# Patient Record
Sex: Female | Born: 1965 | Race: White | Hispanic: No | Smoking: Current every day smoker
Health system: Southern US, Community
[De-identification: ages and names within clinical notes are randomized; demographics above are authoritative.]

## PROBLEM LIST (undated history)

## (undated) ENCOUNTER — Emergency Department (HOSPITAL_COMMUNITY): Admission: EM | Payer: Self-pay | Source: Home / Self Care

## (undated) DIAGNOSIS — M797 Fibromyalgia: Secondary | ICD-10-CM

## (undated) DIAGNOSIS — F32A Depression, unspecified: Secondary | ICD-10-CM

## (undated) DIAGNOSIS — E0500 Thyrotoxicosis with diffuse goiter without thyrotoxic crisis or storm: Secondary | ICD-10-CM

## (undated) DIAGNOSIS — J449 Chronic obstructive pulmonary disease, unspecified: Secondary | ICD-10-CM

## (undated) DIAGNOSIS — G5600 Carpal tunnel syndrome, unspecified upper limb: Secondary | ICD-10-CM

## (undated) DIAGNOSIS — E039 Hypothyroidism, unspecified: Secondary | ICD-10-CM

## (undated) DIAGNOSIS — F419 Anxiety disorder, unspecified: Secondary | ICD-10-CM

## (undated) DIAGNOSIS — K219 Gastro-esophageal reflux disease without esophagitis: Secondary | ICD-10-CM

## (undated) DIAGNOSIS — F329 Major depressive disorder, single episode, unspecified: Secondary | ICD-10-CM

## (undated) HISTORY — DX: Fibromyalgia: M79.7

## (undated) HISTORY — DX: Carpal tunnel syndrome, unspecified upper limb: G56.00

## (undated) HISTORY — PX: KNEE SURGERY: SHX244

## (undated) HISTORY — DX: Depression, unspecified: F32.A

## (undated) HISTORY — PX: OVARIAN CYST SURGERY: SHX726

## (undated) HISTORY — DX: Thyrotoxicosis with diffuse goiter without thyrotoxic crisis or storm: E05.00

## (undated) HISTORY — DX: Gastro-esophageal reflux disease without esophagitis: K21.9

## (undated) HISTORY — DX: Anxiety disorder, unspecified: F41.9

## (undated) HISTORY — DX: Major depressive disorder, single episode, unspecified: F32.9

## (undated) HISTORY — PX: ABDOMINAL HYSTERECTOMY: SHX81

---

## 2010-09-16 ENCOUNTER — Emergency Department (HOSPITAL_COMMUNITY): Payer: No Typology Code available for payment source

## 2010-09-16 ENCOUNTER — Emergency Department (HOSPITAL_COMMUNITY)
Admission: EM | Admit: 2010-09-16 | Discharge: 2010-09-16 | Disposition: A | Discharge status: Home / Self Care | Payer: No Typology Code available for payment source | Attending: Emergency Medicine | Admitting: Emergency Medicine

## 2010-09-16 DIAGNOSIS — F1010 Alcohol abuse, uncomplicated: Secondary | ICD-10-CM | POA: Insufficient documentation

## 2010-09-16 DIAGNOSIS — T07XXXA Unspecified multiple injuries, initial encounter: Secondary | ICD-10-CM | POA: Insufficient documentation

## 2010-09-16 DIAGNOSIS — M25579 Pain in unspecified ankle and joints of unspecified foot: Secondary | ICD-10-CM | POA: Insufficient documentation

## 2010-09-16 DIAGNOSIS — M25569 Pain in unspecified knee: Secondary | ICD-10-CM | POA: Insufficient documentation

## 2010-09-16 DIAGNOSIS — M79609 Pain in unspecified limb: Secondary | ICD-10-CM | POA: Insufficient documentation

## 2010-09-16 DIAGNOSIS — R404 Transient alteration of awareness: Secondary | ICD-10-CM | POA: Insufficient documentation

## 2012-09-06 ENCOUNTER — Other Ambulatory Visit (HOSPITAL_COMMUNITY): Payer: Self-pay | Admitting: "Endocrinology

## 2012-09-06 DIAGNOSIS — R7989 Other specified abnormal findings of blood chemistry: Secondary | ICD-10-CM

## 2012-09-09 ENCOUNTER — Encounter (HOSPITAL_COMMUNITY): Payer: Self-pay

## 2012-09-09 ENCOUNTER — Encounter (HOSPITAL_COMMUNITY)
Admission: RE | Admit: 2012-09-09 | Discharge: 2012-09-09 | Disposition: A | Discharge status: Home / Self Care | Payer: BC Managed Care – PPO | Source: Ambulatory Visit | Attending: "Endocrinology | Admitting: "Endocrinology

## 2012-09-09 DIAGNOSIS — R7989 Other specified abnormal findings of blood chemistry: Secondary | ICD-10-CM

## 2012-09-09 DIAGNOSIS — R946 Abnormal results of thyroid function studies: Secondary | ICD-10-CM | POA: Insufficient documentation

## 2012-09-09 MED ORDER — SODIUM IODIDE I 131 CAPSULE
8.0000 | Freq: Once | INTRAVENOUS | Status: AC | PRN
  Administered 2012-09-09: 8 via ORAL

## 2012-09-10 ENCOUNTER — Encounter (HOSPITAL_COMMUNITY)
Admission: RE | Admit: 2012-09-10 | Discharge: 2012-09-10 | Disposition: A | Discharge status: Home / Self Care | Payer: BC Managed Care – PPO | Source: Ambulatory Visit | Attending: "Endocrinology | Admitting: "Endocrinology

## 2012-09-10 ENCOUNTER — Other Ambulatory Visit (HOSPITAL_COMMUNITY): Payer: Self-pay | Admitting: "Endocrinology

## 2012-09-10 DIAGNOSIS — E0500 Thyrotoxicosis with diffuse goiter without thyrotoxic crisis or storm: Secondary | ICD-10-CM

## 2012-09-10 MED ORDER — SODIUM PERTECHNETATE TC 99M INJECTION
10.0000 | Freq: Once | INTRAVENOUS | Status: AC | PRN
  Administered 2012-09-10: 10 via INTRAVENOUS

## 2012-09-16 ENCOUNTER — Encounter (HOSPITAL_COMMUNITY): Payer: Self-pay

## 2012-09-16 ENCOUNTER — Encounter (HOSPITAL_COMMUNITY)
Admission: RE | Admit: 2012-09-16 | Discharge: 2012-09-16 | Disposition: A | Discharge status: Home / Self Care | Payer: BC Managed Care – PPO | Source: Ambulatory Visit | Attending: "Endocrinology | Admitting: "Endocrinology

## 2012-09-16 DIAGNOSIS — E0500 Thyrotoxicosis with diffuse goiter without thyrotoxic crisis or storm: Secondary | ICD-10-CM | POA: Insufficient documentation

## 2012-09-16 MED ORDER — SODIUM IODIDE I 131 CAPSULE
15.0000 | Freq: Once | INTRAVENOUS | Status: AC | PRN
  Administered 2012-09-16: 15 via ORAL

## 2014-03-29 IMAGING — NM NM RAI THERAPY FOR HYPERTHYROIDISM
1 series · 1 of 1 positions shown · non-contrast
Comparison: None.

CLINICAL DATA: Graves' disease

NUCLEAR MEDICINE RADIOACTIVE IODINE THERAPY FOR HYPERTHYROIDISM
TECHNIQUE: The risks and benefits of radioactive iodine therapy
were discussed with the patient in detail. Alternative therapies
were also mentioned. Radiation safety was discussed with the
patient, including how to protect the general public from exposure.
There were no barriers to communication.  Written consent was
obtained.  The patient then received a capsule containing the
radiopharmaceutical.  The patient will follow-up with the referring
physician.
Radiopharmaceutical: [id number] SEENA A-4M4 SODIUM IODIDE I 131
CAPSULE

[bone scan · 2.33mm/px · 1 of 1 slices shown]
[im 1/1]
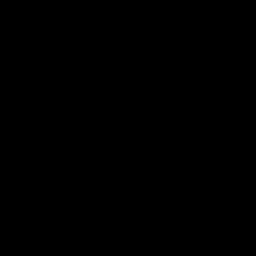

[1 of 1 positions shown; findings below may reference images not displayed]

IMPRESSION: Per oral administration of radiolabeled iodine for the treatment of
hyperthyroidism.

## 2014-11-12 ENCOUNTER — Ambulatory Visit (HOSPITAL_COMMUNITY)
Admission: RE | Admit: 2014-11-12 | Discharge: 2014-11-12 | Disposition: A | Discharge status: Home / Self Care | Payer: 59 | Source: Ambulatory Visit | Attending: Rheumatology | Admitting: Rheumatology

## 2014-11-12 ENCOUNTER — Other Ambulatory Visit (HOSPITAL_COMMUNITY): Payer: Self-pay | Admitting: Rheumatology

## 2014-11-12 DIAGNOSIS — M542 Cervicalgia: Secondary | ICD-10-CM | POA: Insufficient documentation

## 2014-11-12 DIAGNOSIS — M5032 Other cervical disc degeneration, mid-cervical region: Secondary | ICD-10-CM | POA: Insufficient documentation

## 2016-05-11 NOTE — Congregational Nurse Program (Signed)
Congregational Nurse Program Note  Date of Encounter: 05/11/2016  Past Medical History: No past medical history on file.  Encounter Details:     CNP Questionnaire - 05/11/16 1540      Patient Demographics   Is this a new or existing patient? New   Patient is considered a/an Not Applicable   Race Caucasian/White     Patient Assistance   Location of Patient Assistance [facility]   Patient's financial/insurance status Low Income;Self-Pay (Uninsured)   Uninsured Patient ([place]s) Yes   Interventions Counseled to make appt. with provider;Assisted patient in making appt.   Patient referred to apply for the following financial assistance Not Applicable   Food insecurities addressed Not Applicable   Transportation assistance No   Assistance securing medications No   Educational health offerings Chronic disease;Navigating the healthcare system;Behavioral health;Safety     Encounter Details   Primary purpose of visit Chronic Illness/Condition Visit;Education/Health Concerns;Navigating the Healthcare System;Safety;Other   Was an Emergency Department visit averted? Not Applicable   Does patient have a medical provider? No   Patient referred to Area Agency;Clinic;Establish PCP;Other   Was a mental health screening completed? (GAINS tool) No   Does patient have dental issues? No   Does patient have vision issues? No   Does your patient have an abnormal blood pressure today? No   Since previous encounter, have you referred patient for abnormal blood pressure that resulted in a new diagnosis or medication change? No   Does your patient have an abnormal blood glucose today? No   Since previous encounter, have you referred patient for abnormal blood glucose that resulted in a new diagnosis or medication change? No   Was there a life-saving intervention made? No     [facility]. Client has recently moved back to [facility] from [place]. She  lives with her boyfriend currently. Her complaint today is aching all over, no fever or chills with history of fibromyalgia. She has also within the past 2 weeks has been "crying a lot , emotional, with trouble sleeping . States she gets maybe 2 hours of sleep per night. She states her appetite has decreased also. She denies any thoughts of suicide and states she has never had past attempts. Last PCP appointment was in [facility] in 07/2015 when she still had health insurance. Client is tearful easily but alert and oriented to person and place and answers questions appropriately. She reports being out of some of her medications and doesn't know why she is so tearful often. She denies abuse at home and states she feels safe there. She states that her emotional status changed after moving back to [place] and losing her job and income and health insurance. Her boyfriend does not work, but is receiving other financial support from a parent of his. Past medical history per client: Graves disease, fibromyalgia, depression.irritable bowel. Past surgical History per client: Iodine 3 years ago here in [place] by Dr Fransico Him for her Graves. Hysterectomy 2001, Knee surgery. Medications per client: "bottles brought in" Diclofenac 75 mg 1-2 per day Dicyclomine 20 mg 1/2 tab - 1 as needed Tizanidine(has none) 4 mg 1 at night Levothyroxine 112 mcg 1 per day Duloxetine(only 3 capsules left) 60 mg 1 per day Flexeril (out of for 3-4 weeks patient doesn't take doesn't like how it makes her feel) as needed Melatonin  Allergies: Codiene causes stomach pain. Discussed options with client concerning primary medical care without income or health insurance. Client  [facility] [place]. Referral made and appointment secured for client for 05/15/16 appointment as well as contact information for the free clinic given to client. RN also discussed options for counseling regarding her depression and  increased stress. Client states she feels she needs "someone to talk to about her feelings" Client given [facility] number as well as walk in times [facility] . Client feels she wants to wait until after her medical appointment. Again denies suicidal thoughts and denies any abuse in the home and states she is safe at home. RN also offered to make an appointment with MSW intern with [facility] program Irwin Brakeman[facility] for next Thursday at 1:30 Pm [facility] for further needs assessment and assistance navigating Mental Health needs. RN again gave client Mental health Crisis number for [facility] as well as the days and times for walk in intakes at [facility] [place] and client gives verbal understanding of how to access these as needed. Client states she will meet with  Social Work MSW intern on 05/18/16 as scheduled . RN will call to follow up with client after her medical appointment with the [facility] on 05/15/16  All information as well as summary of recommendations today and contact information and appointment cards given to client upon completion of screening appointment. Will follow as needed.

## 2016-05-15 ENCOUNTER — Ambulatory Visit: Payer: Self-pay | Admitting: Physician Assistant

## 2016-05-15 ENCOUNTER — Encounter: Payer: Self-pay | Admitting: Physician Assistant

## 2016-05-15 VITALS — BP 128/70 | HR 82 | Temp 98.1°F | Ht 65.0 in | Wt 152.0 lb

## 2016-05-15 DIAGNOSIS — G8929 Other chronic pain: Secondary | ICD-10-CM

## 2016-05-15 DIAGNOSIS — F32A Depression, unspecified: Secondary | ICD-10-CM

## 2016-05-15 DIAGNOSIS — F17219 Nicotine dependence, cigarettes, with unspecified nicotine-induced disorders: Secondary | ICD-10-CM

## 2016-05-15 DIAGNOSIS — N898 Other specified noninflammatory disorders of vagina: Secondary | ICD-10-CM

## 2016-05-15 DIAGNOSIS — Z131 Encounter for screening for diabetes mellitus: Secondary | ICD-10-CM

## 2016-05-15 DIAGNOSIS — F329 Major depressive disorder, single episode, unspecified: Secondary | ICD-10-CM

## 2016-05-15 DIAGNOSIS — E890 Postprocedural hypothyroidism: Secondary | ICD-10-CM

## 2016-05-15 DIAGNOSIS — F1010 Alcohol abuse, uncomplicated: Secondary | ICD-10-CM

## 2016-05-15 DIAGNOSIS — Z13220 Encounter for screening for lipoid disorders: Secondary | ICD-10-CM

## 2016-05-15 LAB — POCT WET PREP (WET MOUNT): Trichomonas Wet Prep HPF POC: ABSENT

## 2016-05-15 LAB — GLUCOSE, POCT (MANUAL RESULT ENTRY): POC GLUCOSE: 96 mg/dL (ref 70–99)

## 2016-05-15 NOTE — Progress Notes (Signed)
BP 128/70 (BP Location: Left Arm, Patient Position: Sitting, Cuff Size: Normal)   Pulse 82   Temp 98.1 F (36.7 C)   Ht 5\' 5"  (1.651 m)   Wt 152 lb (68.9 kg)   SpO2 99%   BMI 25.29 kg/m    Subjective:    Patient ID: Tara Lewis, female    DOB: 1965/07/10, 51 y.o.   MRN: [id number]  HPI: Ashelynn Lewis is a 51 y.o. female presenting on 05/15/2016 for New Patient (Initial Visit); Mass (on L hand); Vaginal Discharge (tinted brown discharge for about a month, pt states itchy, denies burning.); and Mental Health Problem (pt has appointment with intern social worker with Congregational nursing.)   HPI   Chief Complaint  Patient presents with  . New Patient (Initial Visit)  . Mass    on L hand  . Vaginal Discharge    tinted brown discharge for about a month, pt states itchy, denies burning.  . Mental Health Problem    pt has appointment with intern social worker with Congregational nursing.     Pt states no sexual desire.  Last intercourse approx October.  Her fiancee, Tara Lewis,  is with her today.   Pt is not working.  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  She has filed for disability.    Pt states depression started about 2 months ago.  She says no big changes but she just started feeling this way.    Pt states hysterectomy about 2000 for "cysts kept bursting".  She had ovaries removed also.   Pt with vaginal discharge.  Says it comes and goes.  She says she can't smell because she had TBI in 2010.  Mass L hand only sometimes has pressure feeling.  Says it gets bigger and smaller.   She says she saw dr Case an othopedist who ordered an [place] at imaging place in [place].  She was told it was a fluid pocket.   Previous PCP dr Margo Common in [place]  Denies SOB.  States smoking more now that is depressed.    Relevant past medical, surgical, family and social history reviewed and updated as indicated. Interim medical history since our  last visit reviewed. Allergies and medications reviewed and updated.   Current Outpatient Prescriptions:  .  diclofenac (VOLTAREN) 75 MG EC tablet, Take 75 mg by mouth 2 (two) times daily., Disp: , Rfl:  .  dicyclomine (BENTYL) 20 MG tablet, Take 10-20 mg by mouth 2 (two) times daily as needed (irritable bowel)., Disp: , Rfl:  .  levothyroxine (SYNTHROID, LEVOTHROID) 112 MCG tablet, Take 112 mcg by mouth daily before breakfast., Disp: , Rfl:  .  cyclobenzaprine (FLEXERIL) 5 MG tablet, Take 5 mg by mouth daily as needed for muscle spasms., Disp: , Rfl:  .  DULoxetine (CYMBALTA) 60 MG capsule, Take 60 mg by mouth daily., Disp: , Rfl:  .  tiZANidine (ZANAFLEX) 4 MG tablet, Take 4 mg by mouth at bedtime as needed for muscle spasms., Disp: , Rfl:    Review of Systems  Constitutional: Positive for appetite change, chills, diaphoresis and fatigue. Negative for fever and unexpected weight change.  HENT: Positive for dental problem. Negative for congestion, drooling, ear pain, facial swelling, hearing loss, mouth sores, sneezing, sore throat, trouble swallowing and voice change.   Eyes: Positive for visual disturbance. Negative for pain, discharge, redness and itching.  Respiratory: Positive for shortness of breath. Negative for cough,  choking and wheezing.   Cardiovascular: Negative for chest pain, palpitations and leg swelling.  Gastrointestinal: Positive for abdominal pain and diarrhea. Negative for blood in stool, constipation and vomiting.  Endocrine: Positive for cold intolerance, heat intolerance and polydipsia.  Genitourinary: Negative for decreased urine volume, dysuria and hematuria.  Musculoskeletal: Positive for arthralgias, back pain and gait problem.  Skin: Negative for rash.  Allergic/Immunologic: Positive for environmental allergies.  Neurological: Positive for light-headedness and headaches. Negative for seizures and syncope.  Hematological: Negative for adenopathy.    Psychiatric/Behavioral: Positive for agitation and dysphoric mood. Negative for suicidal ideas. The patient is nervous/anxious.     Per HPI unless specifically indicated above     Objective:    BP 128/70 (BP Location: Left Arm, Patient Position: Sitting, Cuff Size: Normal)   Pulse 82   Temp 98.1 F (36.7 C)   Ht 5\' 5"  (1.651 m)   Wt 152 lb (68.9 kg)   SpO2 99%   BMI 25.29 kg/m   Wt Readings from Last 3 Encounters:  05/15/16 152 lb (68.9 kg)  05/11/16 151 lb 6.4 oz (68.7 kg)    Physical Exam  Constitutional: She is oriented to person, place, and time. She appears well-developed and well-nourished.  HENT:  Head: Normocephalic and atraumatic.  Mouth/Throat: Oropharynx is clear and moist. No oropharyngeal exudate.  Eyes: Conjunctivae and EOM are normal. Pupils are equal, round, and reactive to light.  Neck: Neck supple. No thyromegaly present.  Cardiovascular: Normal rate and regular rhythm.   Pulmonary/Chest: Effort normal and breath sounds normal.  Abdominal: Soft. Bowel sounds are normal. She exhibits no mass. There is no hepatosplenomegaly. There is no tenderness.  Genitourinary: Vagina normal. There is no rash, tenderness or lesion on the right labia. There is no rash or tenderness on the left labia. No erythema in the vagina. No foreign body in the vagina. No vaginal discharge found.  Genitourinary Comments: (nurse Berenice assisted)  Musculoskeletal: She exhibits no edema.       Left hand: She exhibits normal range of motion, no tenderness, no bony tenderness and normal capillary refill.       Hands: Left hand with what feels like cyst.  Non-tender. No redness.    Lymphadenopathy:    She has no cervical adenopathy.  Neurological: She is alert and oriented to person, place, and time. Gait normal.  Skin: Skin is warm and dry.  Psychiatric: She has a normal mood and affect. Her behavior is normal.  Vitals reviewed.   Results for orders placed or performed in visit on  05/15/16  POCT Glucose (CBG)  Result Value Ref Range   POC Glucose 96 70 - 99 mg/dl      Assessment & Plan:   Encounter Diagnoses  Name Primary?  . Postablative hypothyroidism Yes  . Depression, unspecified depression type   . Alcohol abuse   . Cigarette nicotine dependence with nicotine-induced disorder   . Other chronic pain   . Vaginal discharge   . Screening for diabetes mellitus (DM)   . Screening cholesterol level     -[facility] ([facility]) -discussed alcohol and Pt believes she alcoholic.  Gave list local AA meetings and encouraged her to attend -Requested records for US hand and last mammogram, both [facility] [place] -pt will get baseline labs tomorrow morning while fasting -discussed with pt that [facility] does not treat chronic pain.  She states understanding -pt to contact MH.  She has card for [facility] and I counseled her to contact asap to get help with her mood disorder -follow up 3 weeks.  RTO sooner prn

## 2016-05-22 ENCOUNTER — Telehealth: Payer: Self-pay

## 2016-05-22 NOTE — Telephone Encounter (Signed)
Called to follow up with client regarding appointment with MSW intern for this week. Client scheduled for 05/25/16. Client will call to reschedule if she cannot come on that date.

## 2016-06-05 ENCOUNTER — Ambulatory Visit: Payer: Self-pay | Admitting: Physician Assistant

## 2016-06-12 LAB — COMPREHENSIVE METABOLIC PANEL
ALBUMIN: 4 g/dL (ref 3.6–5.1)
ALK PHOS: 63 U/L (ref 33–130)
ALT: 8 U/L (ref 6–29)
AST: 13 U/L (ref 10–35)
BILIRUBIN TOTAL: 0.6 mg/dL (ref 0.2–1.2)
BUN: 13 mg/dL (ref 7–25)
CALCIUM: 9.6 mg/dL (ref 8.6–10.4)
CO2: 27 mmol/L (ref 20–31)
Chloride: 107 mmol/L (ref 98–110)
Creat: 0.76 mg/dL (ref 0.50–1.05)
Glucose, Bld: 96 mg/dL (ref 65–99)
Potassium: 4.1 mmol/L (ref 3.5–5.3)
Sodium: 142 mmol/L (ref 135–146)
Total Protein: 6.8 g/dL (ref 6.1–8.1)

## 2016-06-12 LAB — CBC WITH DIFFERENTIAL/PLATELET
BASOS ABS: 81 {cells}/uL (ref 0–200)
BASOS PCT: 1 %
EOS PCT: 4 %
Eosinophils Absolute: 324 cells/uL (ref 15–500)
HCT: 43.4 % (ref 35.0–45.0)
Hemoglobin: 14.6 g/dL (ref 11.7–15.5)
Lymphocytes Relative: 23 %
Lymphs Abs: 1863 cells/uL (ref 850–3900)
MCH: 30.8 pg (ref 27.0–33.0)
MCHC: 33.6 g/dL (ref 32.0–36.0)
MCV: 91.6 fL (ref 80.0–100.0)
MONOS PCT: 8 %
MPV: 9.7 fL (ref 7.5–12.5)
Monocytes Absolute: 648 cells/uL (ref 200–950)
NEUTROS ABS: 5184 {cells}/uL (ref 1500–7800)
Neutrophils Relative %: 64 %
PLATELETS: 266 10*3/uL (ref 140–400)
RBC: 4.74 MIL/uL (ref 3.80–5.10)
RDW: 13.1 % (ref 11.0–15.0)
WBC: 8.1 10*3/uL (ref 3.8–10.8)

## 2016-06-12 LAB — LIPID PANEL
CHOL/HDL RATIO: 5.1 ratio — AB (ref <5.0)
CHOLESTEROL: 255 mg/dL — AB (ref <200)
HDL: 50 mg/dL — ABNORMAL LOW (ref >50)
LDL Cholesterol: 166 mg/dL — ABNORMAL HIGH (ref <100)
Triglycerides: 195 mg/dL — ABNORMAL HIGH (ref <150)
VLDL: 39 mg/dL — ABNORMAL HIGH (ref <30)

## 2016-06-12 LAB — HEMOGLOBIN A1C
Hgb A1c MFr Bld: 4.9 % (ref <5.7)
Mean Plasma Glucose: 94 mg/dL

## 2016-06-12 LAB — TSH: TSH: 2.76 mIU/L

## 2016-06-13 ENCOUNTER — Encounter: Payer: Self-pay | Admitting: Physician Assistant

## 2016-06-13 ENCOUNTER — Ambulatory Visit: Payer: Self-pay | Admitting: Physician Assistant

## 2016-06-13 ENCOUNTER — Other Ambulatory Visit: Payer: Self-pay | Admitting: Physician Assistant

## 2016-06-13 VITALS — BP 108/62 | HR 56 | Temp 97.9°F | Ht 65.0 in | Wt 157.0 lb

## 2016-06-13 DIAGNOSIS — Z1211 Encounter for screening for malignant neoplasm of colon: Secondary | ICD-10-CM

## 2016-06-13 DIAGNOSIS — Z1239 Encounter for other screening for malignant neoplasm of breast: Secondary | ICD-10-CM

## 2016-06-13 DIAGNOSIS — E785 Hyperlipidemia, unspecified: Secondary | ICD-10-CM

## 2016-06-13 DIAGNOSIS — F1010 Alcohol abuse, uncomplicated: Secondary | ICD-10-CM

## 2016-06-13 DIAGNOSIS — R2232 Localized swelling, mass and lump, left upper limb: Secondary | ICD-10-CM

## 2016-06-13 DIAGNOSIS — F17219 Nicotine dependence, cigarettes, with unspecified nicotine-induced disorders: Secondary | ICD-10-CM

## 2016-06-13 DIAGNOSIS — G8929 Other chronic pain: Secondary | ICD-10-CM

## 2016-06-13 DIAGNOSIS — F329 Major depressive disorder, single episode, unspecified: Secondary | ICD-10-CM

## 2016-06-13 DIAGNOSIS — F32A Depression, unspecified: Secondary | ICD-10-CM

## 2016-06-13 DIAGNOSIS — E890 Postprocedural hypothyroidism: Secondary | ICD-10-CM

## 2016-06-13 MED ORDER — DULOXETINE HCL 60 MG PO CPEP: 60.0000 mg | ORAL_CAPSULE | Freq: Every day | ORAL | 3 refills | Status: DC

## 2016-06-13 MED ORDER — LEVOTHYROXINE SODIUM 112 MCG PO TABS: 112.0000 ug | ORAL_TABLET | Freq: Every day | ORAL | 4 refills | Status: DC

## 2016-06-13 NOTE — Progress Notes (Signed)
BP 108/62 (BP Location: Left Arm, Patient Position: Sitting, Cuff Size: Normal)   Pulse (!) 56   Temp 97.9 F (36.6 C)   Ht _0  (1.651 m)   Wt 157 lb (71.2 kg)   SpO2 97%   BMI 26.13 kg/m    Subjective:    Patient ID: Tara Lewis, female    DOB: 1966-02-01, 51 y.o.   MRN: [id number]  HPI: Tara Lewis is a 51 y.o. female presenting on 06/13/2016 for Follow-up   HPI   Pt states mood improved. She is no longer engaged and thinks her fiance was a lot of her cause of crying.  She is still sleeping poorly.   Pt has not seen a counselor but doesn't know if she needs to now.    She says she is doing better with etoh.  She hasn't drank in 3 or 4 days.  She has also cut back on smoking.   Relevant past medical, surgical, family and social history reviewed and updated as indicated. Interim medical history since our last visit reviewed. Allergies and medications reviewed and updated.   Current Outpatient Prescriptions:  .  diclofenac (VOLTAREN) 75 MG EC tablet, Take 75 mg by mouth 2 (two) times daily as needed. , Disp: , Rfl:  .  dicyclomine (BENTYL) 20 MG tablet, Take 10-20 mg by mouth daily. , Disp: , Rfl:  .  DULoxetine (CYMBALTA) 60 MG capsule, Take 60 mg by mouth daily., Disp: , Rfl:  .  Melatonin 10 MG CAPS, Take 10 mg by mouth at bedtime., Disp: , Rfl:  .  tiZANidine (ZANAFLEX) 4 MG tablet, Take 4 mg by mouth at bedtime as needed for muscle spasms., Disp: , Rfl:  .  cyclobenzaprine (FLEXERIL) 5 MG tablet, Take 5 mg by mouth daily as needed for muscle spasms., Disp: , Rfl:  .  levothyroxine (SYNTHROID, LEVOTHROID) 112 MCG tablet, Take 112 mcg by mouth daily before breakfast., Disp: , Rfl:    Review of Systems  Constitutional: Positive for diaphoresis and fatigue. Negative for appetite change, chills, fever and unexpected weight change.  HENT: Positive for congestion, dental problem and sneezing. Negative for drooling, ear pain, facial swelling, hearing loss, mouth  sores, sore throat, trouble swallowing and voice change.   Eyes: Negative for pain, discharge, redness, itching and visual disturbance.  Respiratory: Positive for cough and shortness of breath. Negative for choking and wheezing.   Cardiovascular: Negative for chest pain, palpitations and leg swelling.  Gastrointestinal: Positive for abdominal pain and diarrhea. Negative for blood in stool, constipation and vomiting.  Endocrine: Positive for cold intolerance and heat intolerance. Negative for polydipsia.  Genitourinary: Negative for decreased urine volume, dysuria and hematuria.  Musculoskeletal: Positive for arthralgias and back pain. Negative for gait problem.  Skin: Negative for rash.  Allergic/Immunologic: Positive for environmental allergies.  Neurological: Positive for headaches. Negative for seizures, syncope and light-headedness.  Hematological: Negative for adenopathy.  Psychiatric/Behavioral: Positive for agitation and dysphoric mood. Negative for suicidal ideas. The patient is nervous/anxious.     Per HPI unless specifically indicated above     Objective:    BP 108/62 (BP Location: Left Arm, Patient Position: Sitting, Cuff Size: Normal)   Pulse (!) 56   Temp 97.9 F (36.6 C)   Ht _1  (1.651 m)   Wt 157 lb (71.2 kg)   SpO2 97%   BMI 26.13 kg/m   Wt Readings from Last 3 Encounters:  06/13/16 157 lb (71.2 kg)  05/15/16 152 lb (  68.9 kg)  05/11/16 151 lb 6.4 oz (68.7 kg)    Physical Exam  Constitutional: She is oriented to person, place, and time. She appears well-developed and well-nourished.  HENT:  Head: Normocephalic and atraumatic.  Neck: Neck supple.  Cardiovascular: Normal rate and regular rhythm.   Pulmonary/Chest: Effort normal and breath sounds normal.  Abdominal: Soft. Bowel sounds are normal. She exhibits no mass. There is no hepatosplenomegaly. There is no tenderness.  Musculoskeletal: She exhibits no edema.  Lymphadenopathy:    She has no cervical  adenopathy.  Neurological: She is alert and oriented to person, place, and time.  Skin: Skin is warm and dry.  Psychiatric: She has a normal mood and affect. Her behavior is normal.  Vitals reviewed.   Results for orders placed or performed in visit on 05/15/16  TSH  Result Value Ref Range   TSH 2.76 mIU/L  HgB A1c  Result Value Ref Range   Hgb A1c MFr Bld 4.9 <5.7 %   Mean Plasma Glucose 94 mg/dL  Comprehensive Metabolic Panel (CMET)  Result Value Ref Range   Sodium 142 135 - 146 mmol/L   Potassium 4.1 3.5 - 5.3 mmol/L   Chloride 107 98 - 110 mmol/L   CO2 27 20 - 31 mmol/L   Glucose, Bld 96 65 - 99 mg/dL   BUN 13 7 - 25 mg/dL   Creat 0.76 0.50 - 1.05 mg/dL   Total Bilirubin 0.6 0.2 - 1.2 mg/dL   Alkaline Phosphatase 63 33 - 130 U/L   AST 13 10 - 35 U/L   ALT 8 6 - 29 U/L   Total Protein 6.8 6.1 - 8.1 g/dL   Albumin 4.0 3.6 - 5.1 g/dL   Calcium 9.6 8.6 - 10.4 mg/dL  CBC w/Diff/Platelet  Result Value Ref Range   WBC 8.1 3.8 - 10.8 K/uL   RBC 4.74 3.80 - 5.10 MIL/uL   Hemoglobin 14.6 11.7 - 15.5 g/dL   HCT 43.4 35.0 - 45.0 %   MCV 91.6 80.0 - 100.0 fL   MCH 30.8 27.0 - 33.0 pg   MCHC 33.6 32.0 - 36.0 g/dL   RDW 13.1 11.0 - 15.0 %   Platelets 266 140 - 400 K/uL   MPV 9.7 7.5 - 12.5 fL   Neutro Abs 5,184 1,500 - 7,800 cells/uL   Lymphs Abs 1,863 850 - 3,900 cells/uL   Monocytes Absolute 648 200 - 950 cells/uL   Eosinophils Absolute 324 15 - 500 cells/uL   Basophils Absolute 81 0 - 200 cells/uL   Neutrophils Relative % 64 %   Lymphocytes Relative 23 %   Monocytes Relative 8 %   Eosinophils Relative 4 %   Basophils Relative 1 %   Smear Review Criteria for review not met   Lipid Profile  Result Value Ref Range   Cholesterol 255 (H) <200 mg/dL   Triglycerides 195 (H) <150 mg/dL   HDL 50 (L) >50 mg/dL   Total CHOL/HDL Ratio 5.1 (H) <5.0 Ratio   VLDL 39 (H) <30 mg/dL   LDL Cholesterol 166 (H) <100 mg/dL  POCT Glucose (CBG)  Result Value Ref Range   POC Glucose 96  70 - 99 mg/dl  POCT Wet Prep (Wet [place])  Result Value Ref Range   Source Wet Prep POC     WBC, Wet Prep HPF POC     Bacteria Wet Prep HPF POC  Few   BACTERIA WET PREP MORPHOLOGY POC     Clue Cells Wet Prep  HPF POC None None   Clue Cells Wet Prep Whiff POC     Yeast Wet Prep HPF POC None    KOH Wet Prep POC none    Trichomonas Wet Prep HPF POC Absent Absent      Assessment & Plan:   Encounter Diagnoses  Name Primary?  . Postablative hypothyroidism Yes  . Hyperlipidemia, unspecified hyperlipidemia type   . Depression, unspecified depression type   . Cigarette nicotine dependence with nicotine-induced disorder   . Alcohol abuse   . Other chronic pain   . Mass of left hand     -reviewed labs with pt -Pt states statins makes legs hurt too bad. Discussed and she Will do lowfat diet and recheck in 3 months -order screening Mammogram -Continue current levothyroxine.  -she is given iFOBT for colon cancer screening -will Check hand [place] and notify pt if further evaluation needs to be done -encouraged pt to continue to decrease and even discontinue etoh -follow up 3 months.  RTO sooner prn

## 2016-06-20 ENCOUNTER — Other Ambulatory Visit: Payer: Self-pay | Admitting: Physician Assistant

## 2016-06-20 ENCOUNTER — Telehealth: Payer: Self-pay | Admitting: Student

## 2016-06-20 DIAGNOSIS — R2232 Localized swelling, mass and lump, left upper limb: Secondary | ICD-10-CM

## 2016-06-20 NOTE — Telephone Encounter (Signed)
Called patient on 06-14-16 to notify her that PA reviewed US done 07-23-2013. PA states that US recommends MRI to evaluate. Pt was given the option whether to get it done or not. Pt prefers to do the MRI. Pt was instructed to fill out and [facility] once received in the mail for financial assistance for the MRI. Pt verbalized understanding. [facility] application was mailed to patient 06-14-16. PA notified and will place order for scheduling.

## 2016-06-23 ENCOUNTER — Ambulatory Visit (HOSPITAL_COMMUNITY)
Admission: RE | Admit: 2016-06-23 | Discharge: 2016-06-23 | Disposition: A | Discharge status: Home / Self Care | Payer: Self-pay | Source: Ambulatory Visit | Attending: Physician Assistant | Admitting: Physician Assistant

## 2016-06-23 DIAGNOSIS — R2232 Localized swelling, mass and lump, left upper limb: Secondary | ICD-10-CM | POA: Insufficient documentation

## 2016-06-23 MED ORDER — GADOBENATE DIMEGLUMINE 529 MG/ML IV SOLN
14.0000 mL | Freq: Once | INTRAVENOUS | Status: AC | PRN
  Administered 2016-06-23: 14 mL via INTRAVENOUS

## 2016-06-29 ENCOUNTER — Other Ambulatory Visit: Payer: Self-pay | Admitting: Physician Assistant

## 2016-06-29 DIAGNOSIS — R2232 Localized swelling, mass and lump, left upper limb: Secondary | ICD-10-CM

## 2016-06-29 DIAGNOSIS — R9389 Abnormal findings on diagnostic imaging of other specified body structures: Secondary | ICD-10-CM

## 2016-07-10 ENCOUNTER — Encounter (HOSPITAL_COMMUNITY): Payer: Self-pay

## 2016-07-10 ENCOUNTER — Inpatient Hospital Stay (HOSPITAL_COMMUNITY)
Admission: RE | Admit: 2016-07-10 | Discharge: 2016-07-10 | Disposition: A | Discharge status: Home / Self Care | Payer: Self-pay | Source: Ambulatory Visit | Attending: Physician Assistant | Admitting: Physician Assistant

## 2016-07-11 ENCOUNTER — Encounter: Payer: Self-pay | Admitting: Physician Assistant

## 2016-08-16 ENCOUNTER — Other Ambulatory Visit: Payer: Self-pay | Admitting: Physician Assistant

## 2016-08-16 MED ORDER — DULOXETINE HCL 60 MG PO CPEP: 60.0000 mg | ORAL_CAPSULE | Freq: Every day | ORAL | 0 refills | Status: DC

## 2016-08-16 MED ORDER — LEVOTHYROXINE SODIUM 112 MCG PO TABS: 112.0000 ug | ORAL_TABLET | Freq: Every day | ORAL | 0 refills | Status: DC

## 2016-09-05 ENCOUNTER — Ambulatory Visit: Payer: Self-pay | Admitting: Physician Assistant

## 2016-09-13 ENCOUNTER — Encounter: Payer: Self-pay | Admitting: Orthopedic Surgery

## 2016-09-26 ENCOUNTER — Other Ambulatory Visit: Payer: Self-pay | Admitting: Physician Assistant

## 2016-10-03 ENCOUNTER — Other Ambulatory Visit: Payer: Self-pay | Admitting: Physician Assistant

## 2016-10-03 MED ORDER — DULOXETINE HCL 60 MG PO CPEP: 60.0000 mg | ORAL_CAPSULE | Freq: Every day | ORAL | 0 refills | Status: DC

## 2016-10-10 ENCOUNTER — Encounter: Payer: Self-pay | Admitting: Physician Assistant

## 2016-10-10 ENCOUNTER — Ambulatory Visit: Payer: Self-pay | Admitting: Physician Assistant

## 2016-10-10 VITALS — BP 110/78 | HR 94 | Temp 97.7°F | Ht 65.0 in | Wt 158.5 lb

## 2016-10-10 DIAGNOSIS — G8929 Other chronic pain: Secondary | ICD-10-CM

## 2016-10-10 DIAGNOSIS — E785 Hyperlipidemia, unspecified: Secondary | ICD-10-CM

## 2016-10-10 DIAGNOSIS — Z1239 Encounter for other screening for malignant neoplasm of breast: Secondary | ICD-10-CM

## 2016-10-10 DIAGNOSIS — E890 Postprocedural hypothyroidism: Secondary | ICD-10-CM

## 2016-10-10 MED ORDER — DICLOFENAC SODIUM 75 MG PO TBEC: 75.0000 mg | DELAYED_RELEASE_TABLET | Freq: Two times a day (BID) | ORAL | 1 refills | Status: DC | PRN

## 2016-10-10 NOTE — Progress Notes (Signed)
BP 110/78 (BP Location: Left Arm, Patient Position: Sitting, Cuff Size: Normal)   Pulse 94   Temp 97.7 F (36.5 C)   Ht 5\' 5"  (1.651 m)   Wt 158 lb 8 oz (71.9 kg)   SpO2 95%   BMI 26.38 kg/m    Subjective:    Patient ID: Tara Lewis Wickliff, female    DOB: 1965-06-19, 51 y.o.   MRN: [id number]  HPI: Tara Lewis Purdie is a 51 y.o. female presenting on 10/10/2016 for Hyperlipidemia; Thyroid Problem; Mental Health Problem; and Follow-up ([facility] to 08-30-16. pt states she was admitted for a infection in skin tissue in mouth, pt states it started off as a tooth infection)   HPI  Chief Complaint  Patient presents with  . Hyperlipidemia  . Thyroid Problem  . Mental Health Problem  . Follow-up    pt [facility] from 08-21[facility]16 to 08-30-16. pt states she was admitted for a infection in skin tissue in mouth, pt states it started off as a tooth infection    Pt says she sent ifobt in the mail  Pt says she is doing well  Relevant past medical, surgical, family and social history reviewed and updated as indicated. Interim medical history since our last visit reviewed. Allergies and medications reviewed and updated.   Current Outpatient Prescriptions:  .  DULoxetine (CYMBALTA) 60 MG capsule, Take 1 capsule (60 mg total) by mouth daily., Disp: 30 capsule, Rfl: 0 .  levothyroxine (SYNTHROID, LEVOTHROID) 112 MCG tablet, TAKE 1 TABLET (112 MCG TOTAL) BY MOUTH DAILY BEFORE BREAKFAST., Disp: 14 tablet, Rfl: 0 .  Melatonin 10 MG CAPS, Take 10 mg by mouth at bedtime., Disp: , Rfl:  .  cyclobenzaprine (FLEXERIL) 5 MG tablet, Take 5 mg by mouth daily as needed for muscle spasms., Disp: , Rfl:  .  diclofenac (VOLTAREN) 75 MG EC tablet, Take 75 mg by mouth 2 (two) times daily as needed. , Disp: , Rfl:  .  dicyclomine (BENTYL) 20 MG tablet, Take 10-20 mg by mouth daily. , Disp: , Rfl:  .  tiZANidine (ZANAFLEX) 4 MG tablet, Take 4 mg by  mouth at bedtime as needed for muscle spasms., Disp: , Rfl:   Review of Systems  Constitutional: Positive for fatigue. Negative for appetite change, chills, diaphoresis, fever and unexpected weight change.  HENT: Positive for congestion and sneezing. Negative for dental problem, drooling, ear pain, facial swelling, hearing loss, mouth sores, sore throat, trouble swallowing and voice change.   Eyes: Negative for pain, discharge, redness, itching and visual disturbance.  Respiratory: Positive for cough. Negative for choking, shortness of breath and wheezing.   Cardiovascular: Negative for chest pain, palpitations and leg swelling.  Gastrointestinal: Negative for abdominal pain, blood in stool, constipation, diarrhea and vomiting.  Endocrine: Negative for cold intolerance, heat intolerance and polydipsia.  Genitourinary: Negative for decreased urine volume, dysuria and hematuria.  Musculoskeletal: Positive for arthralgias. Negative for back pain and gait problem.  Skin: Negative for rash.  Allergic/Immunologic: Negative for environmental allergies.  Neurological: Positive for headaches. Negative for seizures, syncope and light-headedness.  Hematological: Negative for adenopathy.  Psychiatric/Behavioral: Positive for dysphoric mood. Negative for agitation and suicidal ideas. The patient is nervous/anxious.     Per HPI unless specifically indicated above     Objective:    BP 110/78 (BP Location: Left Arm, Patient Position: Sitting, Cuff Size: Normal)   Pulse 94   Temp 97.7  F (36.5 C)   Ht 5\' 5"  (1.651 m)   Wt 158 lb 8 oz (71.9 kg)   SpO2 95%   BMI 26.38 kg/m   Wt Readings from Last 3 Encounters:  10/10/16 158 lb 8 oz (71.9 kg)  06/13/16 157 lb (71.2 kg)  05/15/16 152 lb (68.9 kg)    Physical Exam  Constitutional: She is oriented to person, place, and time. She appears well-developed and well-nourished.  HENT:  Head: Normocephalic and atraumatic.  Neck: Neck supple.   Cardiovascular: Normal rate and regular rhythm.   Pulmonary/Chest: Effort normal and breath sounds normal.  Abdominal: Soft. Bowel sounds are normal. She exhibits no mass. There is no hepatosplenomegaly. There is no tenderness.  Musculoskeletal: She exhibits no edema.  Lymphadenopathy:    She has no cervical adenopathy.  Neurological: She is alert and oriented to person, place, and time.  Skin: Skin is warm and dry.  Psychiatric: She has a normal mood and affect. Her behavior is normal.  Vitals reviewed.         Assessment & Plan:   Encounter Diagnoses  Name Primary?  . Postablative hypothyroidism Yes  . Hyperlipidemia, unspecified hyperlipidemia type   . Screening for breast cancer   . Other chronic pain      -Pt to get labs drawn. Will call with results. Pt says she doesn't want to go on a statin due to myalgias in the past -will Reschedule mammogram -pt to follow up in 3 months. RTO sooner prn

## 2016-10-18 ENCOUNTER — Ambulatory Visit: Payer: Self-pay | Admitting: Orthopaedic Surgery

## 2016-10-18 ENCOUNTER — Other Ambulatory Visit: Payer: Self-pay | Admitting: Physician Assistant

## 2016-10-18 ENCOUNTER — Other Ambulatory Visit (HOSPITAL_COMMUNITY)
Admission: RE | Admit: 2016-10-18 | Discharge: 2016-10-18 | Disposition: A | Discharge status: Home / Self Care | Payer: No Typology Code available for payment source | Source: Ambulatory Visit | Attending: Physician Assistant | Admitting: Physician Assistant

## 2016-10-18 DIAGNOSIS — E785 Hyperlipidemia, unspecified: Secondary | ICD-10-CM

## 2016-10-18 DIAGNOSIS — E890 Postprocedural hypothyroidism: Secondary | ICD-10-CM

## 2016-10-18 LAB — COMPREHENSIVE METABOLIC PANEL
ALK PHOS: 75 U/L (ref 38–126)
ALT: 16 U/L (ref 14–54)
AST: 24 U/L (ref 15–41)
Albumin: 4 g/dL (ref 3.5–5.0)
Anion gap: 10 (ref 5–15)
BUN: 8 mg/dL (ref 6–20)
CALCIUM: 9.2 mg/dL (ref 8.9–10.3)
CO2: 24 mmol/L (ref 22–32)
Chloride: 105 mmol/L (ref 101–111)
Creatinine, Ser: 0.86 mg/dL (ref 0.44–1.00)
GFR calc Af Amer: >60 mL/min (ref >60)
GFR calc non Af Amer: >60 mL/min (ref >60)
GLUCOSE: 114 mg/dL — AB (ref 65–99)
Potassium: 3.7 mmol/L (ref 3.5–5.1)
SODIUM: 139 mmol/L (ref 135–145)
Total Bilirubin: 0.5 mg/dL (ref 0.3–1.2)
Total Protein: 7.2 g/dL (ref 6.5–8.1)

## 2016-10-18 LAB — LIPID PANEL
CHOLESTEROL: 235 mg/dL — AB (ref 0–200)
HDL: 43 mg/dL (ref >40)
LDL Cholesterol: 154 mg/dL — ABNORMAL HIGH (ref 0–99)
Total CHOL/HDL Ratio: 5.5 RATIO
Triglycerides: 192 mg/dL — ABNORMAL HIGH (ref <150)
VLDL: 38 mg/dL (ref 0–40)

## 2016-10-18 LAB — TSH: TSH: 0.953 u[IU]/mL (ref 0.350–4.500)

## 2016-11-15 ENCOUNTER — Ambulatory Visit: Payer: Self-pay | Admitting: Orthopaedic Surgery

## 2016-11-18 ENCOUNTER — Other Ambulatory Visit: Payer: Self-pay | Admitting: Physician Assistant

## 2016-12-01 ENCOUNTER — Other Ambulatory Visit: Payer: Self-pay | Admitting: Physician Assistant

## 2017-01-02 ENCOUNTER — Other Ambulatory Visit: Payer: Self-pay | Admitting: Physician Assistant

## 2017-01-03 ENCOUNTER — Other Ambulatory Visit: Payer: Self-pay | Admitting: Physician Assistant

## 2017-01-03 DIAGNOSIS — Z1231 Encounter for screening mammogram for malignant neoplasm of breast: Secondary | ICD-10-CM

## 2017-01-09 ENCOUNTER — Encounter: Payer: Self-pay | Admitting: Physician Assistant

## 2017-01-09 ENCOUNTER — Ambulatory Visit: Payer: Self-pay | Admitting: Physician Assistant

## 2017-01-09 VITALS — BP 120/86 | HR 89 | Temp 97.7°F | Ht 65.0 in | Wt 153.2 lb

## 2017-01-09 DIAGNOSIS — F32A Depression, unspecified: Secondary | ICD-10-CM

## 2017-01-09 DIAGNOSIS — G8929 Other chronic pain: Secondary | ICD-10-CM

## 2017-01-09 DIAGNOSIS — F329 Major depressive disorder, single episode, unspecified: Secondary | ICD-10-CM

## 2017-01-09 DIAGNOSIS — E890 Postprocedural hypothyroidism: Secondary | ICD-10-CM

## 2017-01-09 DIAGNOSIS — E785 Hyperlipidemia, unspecified: Secondary | ICD-10-CM

## 2017-01-09 DIAGNOSIS — F17219 Nicotine dependence, cigarettes, with unspecified nicotine-induced disorders: Secondary | ICD-10-CM

## 2017-01-09 NOTE — Progress Notes (Signed)
BP 120/86 (BP Location: Left Arm, Patient Position: Sitting, Cuff Size: Normal)   Pulse 89   Temp 97.7 F (36.5 C)   Ht  (1.651 m)   Wt 153 lb 4 oz (69.5 kg)   SpO2 99%   BMI 25.50 kg/m    Subjective:    Patient ID: Tara Lewis, female    DOB: 11/27/1965, 51 y.o.   MRN: [id number]  HPI: Tara Lewis is a 51 y.o. female presenting on 01/09/2017 for Thyroid Problem and Hyperlipidemia   HPI   Pt says someone called her about mammogram but she is waiting for a call back to schedule   Pt is still smoking but says she has cut back.  Pt was supposed to start welchol but never did.   She says she is doing well.    Relevant past medical, surgical, family and social history reviewed and updated as indicated. Interim medical history since our last visit reviewed. Allergies and medications reviewed and updated.    Current Outpatient Prescriptions:  .  DULoxetine (CYMBALTA) 60 MG capsule, TAKE ONE CAPSULE BY MOUTH DAILY, Disp: 30 capsule, Rfl: 0 .  levothyroxine (SYNTHROID, LEVOTHROID) 112 MCG tablet, TAKE ONE TABLET BY MOUTH ONCE DAILY BEFORE BREAKFAST, Disp: 30 tablet, Rfl: 2 .  Melatonin 10 MG CAPS, Take 10 mg by mouth at bedtime., Disp: , Rfl:    Review of Systems  Constitutional: Positive for diaphoresis and fatigue. Negative for appetite change, chills, fever and unexpected weight change.  HENT: Negative for congestion, dental problem, drooling, ear pain, facial swelling, hearing loss, mouth sores, sneezing, sore throat, trouble swallowing and voice change.   Eyes: Negative for pain, discharge, redness, itching and visual disturbance.  Respiratory: Positive for cough. Negative for choking, shortness of breath and wheezing.   Cardiovascular: Negative for chest pain, palpitations and leg swelling.  Gastrointestinal: Negative for abdominal pain, blood in stool, constipation, diarrhea and vomiting.  Endocrine: Positive for cold intolerance and heat intolerance.  Negative for polydipsia.  Genitourinary: Negative for decreased urine volume, dysuria and hematuria.  Musculoskeletal: Positive for arthralgias. Negative for back pain and gait problem.  Skin: Negative for rash.  Allergic/Immunologic: Positive for environmental allergies.  Neurological: Negative for seizures, syncope, light-headedness and headaches.  Hematological: Negative for adenopathy.  Psychiatric/Behavioral: Positive for agitation and dysphoric mood. Negative for suicidal ideas. The patient is nervous/anxious.     Per HPI unless specifically indicated above     Objective:    BP 120/86 (BP Location: Left Arm, Patient Position: Sitting, Cuff Size: Normal)   Pulse 89   Temp 97.7 F (36.5 C)   Ht  (1.651 m)   Wt 153 lb 4 oz (69.5 kg)   SpO2 99%   BMI 25.50 kg/m   Wt Readings from Last 3 Encounters:  01/09/17 153 lb 4 oz (69.5 kg)  10/10/16 158 lb 8 oz (71.9 kg)  06/13/16 157 lb (71.2 kg)    Physical Exam  Constitutional: She is oriented to person, place, and time. She appears well-developed and well-nourished.  HENT:  Head: Normocephalic and atraumatic.  Neck: Neck supple.  Cardiovascular: Normal rate and regular rhythm.   Pulmonary/Chest: Effort normal and breath sounds normal.  Abdominal: Soft. Bowel sounds are normal. She exhibits no mass. There is no hepatosplenomegaly. There is no tenderness.  Musculoskeletal: She exhibits no edema.  Lymphadenopathy:    She has no cervical adenopathy.  Neurological: She is alert and oriented to person, place, and time.  Skin: Skin is  warm and dry.  Psychiatric: She has a normal mood and affect. Her behavior is normal.  Vitals reviewed.       Assessment & Plan:    Encounter Diagnoses  Name Primary?  . Postablative hypothyroidism Yes  . Hyperlipidemia, unspecified hyperlipidemia type   . Other chronic pain   . Depression, unspecified depression type   . Cigarette nicotine dependence with nicotine-induced disorder      -pt Signed wechol application.  Pt to start this medication when it arrives to help her cholesterol  -pt to Continue current medications -pt to Follow up 4 months.  RTO sooner prn

## 2017-04-26 ENCOUNTER — Other Ambulatory Visit: Payer: Self-pay | Admitting: Physician Assistant

## 2017-04-26 MED ORDER — LEVOTHYROXINE SODIUM 112 MCG PO TABS: ORAL_TABLET | ORAL | 0 refills | Status: DC

## 2017-05-08 ENCOUNTER — Ambulatory Visit: Payer: Self-pay | Admitting: Physician Assistant

## 2017-05-10 ENCOUNTER — Other Ambulatory Visit (HOSPITAL_COMMUNITY)
Admission: RE | Admit: 2017-05-10 | Discharge: 2017-05-10 | Disposition: A | Discharge status: Home / Self Care | Payer: Self-pay | Source: Ambulatory Visit | Attending: Physician Assistant | Admitting: Physician Assistant

## 2017-05-10 DIAGNOSIS — E890 Postprocedural hypothyroidism: Secondary | ICD-10-CM | POA: Insufficient documentation

## 2017-05-10 DIAGNOSIS — E785 Hyperlipidemia, unspecified: Secondary | ICD-10-CM | POA: Insufficient documentation

## 2017-05-10 LAB — COMPREHENSIVE METABOLIC PANEL
ALK PHOS: 71 U/L (ref 38–126)
ALT: 15 U/L (ref 14–54)
ANION GAP: 12 (ref 5–15)
AST: 25 U/L (ref 15–41)
Albumin: 4.1 g/dL (ref 3.5–5.0)
BUN: 13 mg/dL (ref 6–20)
CALCIUM: 9.5 mg/dL (ref 8.9–10.3)
CO2: 23 mmol/L (ref 22–32)
Chloride: 104 mmol/L (ref 101–111)
Creatinine, Ser: 0.76 mg/dL (ref 0.44–1.00)
Glucose, Bld: 96 mg/dL (ref 65–99)
Potassium: 3.9 mmol/L (ref 3.5–5.1)
Sodium: 139 mmol/L (ref 135–145)
Total Bilirubin: 0.9 mg/dL (ref 0.3–1.2)
Total Protein: 7.5 g/dL (ref 6.5–8.1)

## 2017-05-10 LAB — LIPID PANEL
CHOLESTEROL: 251 mg/dL — AB (ref 0–200)
HDL: 61 mg/dL (ref >40)
LDL Cholesterol: 165 mg/dL — ABNORMAL HIGH (ref 0–99)
TRIGLYCERIDES: 124 mg/dL (ref <150)
Total CHOL/HDL Ratio: 4.1 RATIO
VLDL: 25 mg/dL (ref 0–40)

## 2017-05-10 LAB — TSH: TSH: 5.896 u[IU]/mL — ABNORMAL HIGH (ref 0.350–4.500)

## 2017-05-17 ENCOUNTER — Encounter: Payer: Self-pay | Admitting: Physician Assistant

## 2017-05-17 ENCOUNTER — Ambulatory Visit: Payer: Self-pay | Admitting: Physician Assistant

## 2017-05-17 VITALS — BP 140/80 | HR 84 | Temp 98.1°F | Wt 154.8 lb

## 2017-05-17 DIAGNOSIS — E785 Hyperlipidemia, unspecified: Secondary | ICD-10-CM

## 2017-05-17 DIAGNOSIS — F17219 Nicotine dependence, cigarettes, with unspecified nicotine-induced disorders: Secondary | ICD-10-CM

## 2017-05-17 DIAGNOSIS — E890 Postprocedural hypothyroidism: Secondary | ICD-10-CM

## 2017-05-17 DIAGNOSIS — G8929 Other chronic pain: Secondary | ICD-10-CM

## 2017-05-17 MED ORDER — COLESEVELAM HCL 625 MG PO TABS: 1875.0000 mg | ORAL_TABLET | Freq: Two times a day (BID) | ORAL | 0 refills | Status: DC

## 2017-05-17 NOTE — Patient Instructions (Signed)
Colesevelam tablets What is this medicine? COLESEVELAM (koh le SEV e lam) is used to lower cholesterol in patients who are at risk of heart disease or stroke. This medicine is only for patients whose cholesterol level is not controlled by diet. It is also used in combination with diet and exercise to help lower blood sugar in adults with type 2 diabetes. This medicine may be used for other purposes; ask your health care provider or pharmacist if you have questions. COMMON BRAND NAME(S): WelChol What should I tell my health care provider before I take this medicine? They need to know if you have any of these conditions: -constipation or bowel obstruction -high triglyceride levels -history of pancreatitis caused by high triglyceride levels -an unusual or allergic reaction to colesevelam, other medicines, foods, dyes, or preservatives -pregnant or trying to get pregnant -breast-feeding How should I use this medicine? Take this medicine by mouth with at least 4 ounces (half a glass) of water. Follow the directions on the prescription label. Take with food. Take your medicine at regular intervals. Do not take it more often than directed. Do not stop taking except on your doctor's advice. Talk to your pediatrician regarding the use of this medicine in children. Special care may be needed. Because of the tablet size, it is recommended that children use the oral suspension. Overdosage: If you think you have taken too much of this medicine contact a poison control center or emergency room at once. NOTE: This medicine is only for you. Do not share this medicine with others. What if I miss a dose? If you miss a dose, take it as soon as you can with your next meal. If it is almost time for your next dose, take only that dose. Do not take double or extra doses. What may interact with this medicine? -birth control pills -cyclosporine -insulin -medicines for diabetes like glimepiride, glipizide, and  glyburide -medicines for seizures like carbamazepine, phenobarbital, phenytoin -metformin -olmesartan -thyroid hormones -verapamil -vitamins -warfarin This list may not describe all possible interactions. Give your health care provider a list of all the medicines, herbs, non-prescription drugs, or dietary supplements you use. Also tell them if you smoke, drink alcohol, or use illegal drugs. Some items may interact with your medicine. What should I watch for while using this medicine? Visit your doctor or health care professional for regular checks on your progress. Your blood sugar and other tests will be measured regularly. This medicine is only part of a total cholesterol or blood sugar-lowering program. Your health care professional or dietician can suggest a low-cholesterol and low-fat diet that will reduce your risk of getting heart and blood vessel disease. Avoid alcohol and smoking, and keep a proper exercise schedule. To reduce the chance of getting constipated, drink plenty of water and increase the amount of fiber in your diet. Ask your doctor or health care professional for advice if you are constipated. If you are taking this medicine for diabetes, wear a medical ID bracelet or chain, and carry a card that describes your disease and details of your medicine and dosage times. What side effects may I notice from receiving this medicine? Side effects that you should report to your doctor or health care professional as soon as possible: -allergic reactions like skin rash, itching or hives, swelling of the face, lips, or tongue -bloody or black, tarry stools -breathing problems -muscle pain -nausea, vomiting -severe stomach pain Side effects that usually do not require medical attention (report to your  doctor or health care professional if they continue or are bothersome): -heartburn or indigestion -stomach upset This list may not describe all possible side effects. Call your doctor  for medical advice about side effects. You may report side effects to FDA at 1-800-FDA-1088. Where should I keep my medicine? Keep out of the reach of children. Store at room temperature between 15 and 30 degrees C (59 and 86 degrees F). Protect from moisture. Throw away any unused medicine after the expiration date. NOTE: This sheet is a summary. It may not cover all possible information. If you have questions about this medicine, talk to your doctor, pharmacist, or health care provider.  2018 Elsevier/Gold Standard (2010-11-15 08:43:11)

## 2017-05-17 NOTE — Progress Notes (Signed)
BP 140/80 (BP Location: Left Arm, Patient Position: Sitting, Cuff Size: Normal)   Pulse 84   Temp 98.1 F (36.7 C)   Wt 154 lb 12 oz (70.2 kg)   SpO2 97%   BMI 25.75 kg/m    Subjective:    Patient ID: Tara Lewis, female    DOB: 09/06/1965, 52 y.o.   MRN: [id number]  HPI: Tara Lewis is a 52 y.o. female presenting on 05/17/2017 for Follow-up   HPI   Pt says she is going back to work at [employer]  She stopped the cymbalta.  She says she feels better without it.   Still is still smoking but says she has cut back.  She is not drinking any more  Relevant past medical, surgical, family and social history reviewed and updated as indicated. Interim medical history since our last visit reviewed. Allergies and medications reviewed and updated.   Current Outpatient Medications:  .  levothyroxine (SYNTHROID, LEVOTHROID) 112 MCG tablet, TAKE ONE TABLET BY MOUTH ONCE DAILY BEFORE BREAKFAST, Disp: 30 tablet, Rfl: 0 .  Melatonin 10 MG CAPS, Take 10 mg by mouth at bedtime., Disp: , Rfl:    Review of Systems  Constitutional: Negative for appetite change, chills, diaphoresis, fatigue, fever and unexpected weight change.  HENT: Negative for congestion, dental problem, drooling, ear pain, facial swelling, hearing loss, mouth sores, sneezing, sore throat, trouble swallowing and voice change.   Eyes: Negative for pain, discharge, redness, itching and visual disturbance.  Respiratory: Negative for cough, choking, shortness of breath and wheezing.   Cardiovascular: Negative for chest pain, palpitations and leg swelling.  Gastrointestinal: Positive for diarrhea. Negative for abdominal pain, blood in stool, constipation and vomiting.  Endocrine: Negative for cold intolerance, heat intolerance and polydipsia.  Genitourinary: Negative for decreased urine volume, dysuria and hematuria.  Musculoskeletal: Positive for arthralgias and back pain. Negative for gait problem.  Skin: Negative  for rash.  Allergic/Immunologic: Negative for environmental allergies.  Neurological: Negative for seizures, syncope, light-headedness and headaches.  Hematological: Negative for adenopathy.  Psychiatric/Behavioral: Positive for agitation. Negative for dysphoric mood and suicidal ideas. The patient is nervous/anxious.     Per HPI unless specifically indicated above     Objective:    BP 140/80 (BP Location: Left Arm, Patient Position: Sitting, Cuff Size: Normal)   Pulse 84   Temp 98.1 F (36.7 C)   Wt 154 lb 12 oz (70.2 kg)   SpO2 97%   BMI 25.75 kg/m   Wt Readings from Last 3 Encounters:  05/17/17 154 lb 12 oz (70.2 kg)  01/09/17 153 lb 4 oz (69.5 kg)  10/10/16 158 lb 8 oz (71.9 kg)    Physical Exam  Constitutional: She is oriented to person, place, and time. She appears well-developed and well-nourished.  HENT:  Head: Normocephalic and atraumatic.  Neck: Neck supple.  Cardiovascular: Normal rate and regular rhythm.  Pulmonary/Chest: Effort normal and breath sounds normal.  Abdominal: Soft. Bowel sounds are normal. She exhibits no mass. There is no hepatosplenomegaly. There is no tenderness.  Musculoskeletal: She exhibits no edema.  Lymphadenopathy:    She has no cervical adenopathy.  Neurological: She is alert and oriented to person, place, and time.  Skin: Skin is warm and dry.  Psychiatric: She has a normal mood and affect. Her behavior is normal.  Vitals reviewed.   Results for orders placed or performed during the hospital encounter of 05/10/17  Comprehensive Metabolic Panel (CMET)  Result Value Ref Range   Sodium  139 135 - 145 mmol/L   Potassium 3.9 3.5 - 5.1 mmol/L   Chloride 104 101 - 111 mmol/L   CO2 23 22 - 32 mmol/L   Glucose, Bld 96 65 - 99 mg/dL   BUN 13 6 - 20 mg/dL   Creatinine, Ser 6.210.76 0.44 - 1.00 mg/dL   Calcium 9.5 8.9 - 30.810.3 mg/dL   Total Protein 7.5 6.5 - 8.1 g/dL   Albumin 4.1 3.5 - 5.0 g/dL   AST 25 15 - 41 U/L   ALT 15 14 - 54 U/L    Alkaline Phosphatase 71 38 - 126 U/L   Total Bilirubin 0.9 0.3 - 1.2 mg/dL   GFR calc non Af Amer >60 >60 mL/min   GFR calc Af Amer >60 >60 mL/min   Anion gap 12 5 - 15  Lipid Profile  Result Value Ref Range   Cholesterol 251 (H) 0 - 200 mg/dL   Triglycerides 657124 <846<150 mg/dL   HDL 61 >96>40 mg/dL   Total CHOL/HDL Ratio 4.1 RATIO   VLDL 25 0 - 40 mg/dL   LDL Cholesterol 295165 (H) 0 - 99 mg/dL  TSH  Result Value Ref Range   TSH 5.896 (H) 0.350 - 4.500 uIU/mL      Assessment & Plan:    Encounter Diagnoses  Name Primary?  . Hyperlipidemia, unspecified hyperlipidemia type Yes  . Postablative hypothyroidism   . Cigarette nicotine dependence with nicotine-induced disorder   . Other chronic pain     -reviewed labs with pt -will continue current levothyroxine and monitor -pt's welchol has come in and she is to start this.  She is counseled to continue low-fat diet and encouraged to exercise regularly -pt to follow up in 3 months.  She is to RTO sooner prn (no labs will be ordered as pt reports she expects to have insurance in 3 months and will then no longer be eligible.  Will schedule appt and if insurance falls through, labs can be ordered at that time)

## 2017-06-21 ENCOUNTER — Other Ambulatory Visit: Payer: Self-pay | Admitting: Physician Assistant

## 2017-07-23 ENCOUNTER — Other Ambulatory Visit: Payer: Self-pay | Admitting: Physician Assistant

## 2017-07-23 MED ORDER — LEVOTHYROXINE SODIUM 112 MCG PO TABS: 112.0000 ug | ORAL_TABLET | Freq: Every day | ORAL | 1 refills | Status: DC

## 2017-08-15 ENCOUNTER — Ambulatory Visit: Payer: Self-pay | Admitting: Physician Assistant

## 2017-08-20 ENCOUNTER — Encounter: Payer: Self-pay | Admitting: Physician Assistant

## 2017-10-21 ENCOUNTER — Other Ambulatory Visit: Payer: Self-pay | Admitting: Physician Assistant

## 2017-12-10 ENCOUNTER — Ambulatory Visit: Payer: Medicaid Other | Admitting: Physician Assistant

## 2017-12-10 ENCOUNTER — Other Ambulatory Visit (HOSPITAL_COMMUNITY)
Admission: RE | Admit: 2017-12-10 | Discharge: 2017-12-10 | Disposition: A | Discharge status: Home / Self Care | Payer: Self-pay | Source: Ambulatory Visit | Attending: Physician Assistant | Admitting: Physician Assistant

## 2017-12-10 ENCOUNTER — Encounter: Payer: Self-pay | Admitting: Physician Assistant

## 2017-12-10 VITALS — BP 134/78 | HR 83 | Temp 97.7°F | Ht 65.0 in | Wt 149.0 lb

## 2017-12-10 DIAGNOSIS — E890 Postprocedural hypothyroidism: Secondary | ICD-10-CM

## 2017-12-10 DIAGNOSIS — E785 Hyperlipidemia, unspecified: Secondary | ICD-10-CM

## 2017-12-10 DIAGNOSIS — F17219 Nicotine dependence, cigarettes, with unspecified nicotine-induced disorders: Secondary | ICD-10-CM

## 2017-12-10 LAB — COMPREHENSIVE METABOLIC PANEL
ALT: 12 U/L (ref 0–44)
AST: 20 U/L (ref 15–41)
Albumin: 4.3 g/dL (ref 3.5–5.0)
Alkaline Phosphatase: 68 U/L (ref 38–126)
Anion gap: 9 (ref 5–15)
BUN: 13 mg/dL (ref 6–20)
CHLORIDE: 105 mmol/L (ref 98–111)
CO2: 25 mmol/L (ref 22–32)
CREATININE: 0.69 mg/dL (ref 0.44–1.00)
Calcium: 9.4 mg/dL (ref 8.9–10.3)
Glucose, Bld: 99 mg/dL (ref 70–99)
Potassium: 3.9 mmol/L (ref 3.5–5.1)
Sodium: 139 mmol/L (ref 135–145)
Total Bilirubin: 0.8 mg/dL (ref 0.3–1.2)
Total Protein: 7.7 g/dL (ref 6.5–8.1)

## 2017-12-10 LAB — LIPID PANEL
Cholesterol: 242 mg/dL — ABNORMAL HIGH (ref 0–200)
HDL: 59 mg/dL (ref >40)
LDL CALC: 164 mg/dL — AB (ref 0–99)
TRIGLYCERIDES: 95 mg/dL (ref <150)
Total CHOL/HDL Ratio: 4.1 RATIO
VLDL: 19 mg/dL (ref 0–40)

## 2017-12-10 LAB — TSH: TSH: 1.87 u[IU]/mL (ref 0.350–4.500)

## 2017-12-10 NOTE — Progress Notes (Signed)
BP 134/78 (BP Location: Left Arm, Patient Position: Sitting, Cuff Size: Normal)   Pulse 83   Temp 97.7 F (36.5 C) (Other (Comment))   Ht 5\' 5"  (1.651 m)   Wt 149 lb (67.6 kg)   SpO2 97%   BMI 24.79 kg/m    Subjective:    Patient ID: Tara Lewis, female    DOB: [birth year], 52 y.o.   MRN: [id number]  HPI: Tara RichardsKimberly Stubbe is a 52 y.o. female presenting on 12/10/2017 for Follow-up and Hypothyroidism   HPI   Pt never returned her iFOBT given feb 2018  Pt now says she should get insurance in October. She is still working at [employer]  She is feeling well and has no complaints  Relevant past medical, surgical, family and social history reviewed and updated as indicated. Interim medical history since our last visit reviewed. Allergies and medications reviewed and updated.   Current Outpatient Medications:  .  colesevelam (WELCHOL) 625 MG tablet, Take 3 tablets (1,875 mg total) by mouth 2 (two) times daily with a meal., Disp: 540 tablet, Rfl: 0 .  levothyroxine (SYNTHROID, LEVOTHROID) 112 MCG tablet, TAKE 1 TABLET BY MOUTH EVERY DAY, Disp: 14 tablet, Rfl: 0   Review of Systems  Constitutional: Positive for fatigue. Negative for appetite change, chills, diaphoresis, fever and unexpected weight change.  HENT: Negative for congestion, dental problem, drooling, ear pain, facial swelling, hearing loss, mouth sores, sneezing, sore throat, trouble swallowing and voice change.   Eyes: Positive for visual disturbance. Negative for pain, discharge, redness and itching.  Respiratory: Negative for cough, choking, shortness of breath and wheezing.   Cardiovascular: Negative for chest pain, palpitations and leg swelling.  Gastrointestinal: Negative for abdominal pain, blood in stool, constipation, diarrhea and vomiting.  Endocrine: Negative for cold intolerance, heat intolerance and polydipsia.  Genitourinary: Negative for decreased urine volume, dysuria and hematuria.   Musculoskeletal: Positive for arthralgias and back pain. Negative for gait problem.  Skin: Negative for rash.  Allergic/Immunologic: Negative for environmental allergies.  Neurological: Negative for seizures, syncope, light-headedness and headaches.  Hematological: Negative for adenopathy.  Psychiatric/Behavioral: Negative for agitation, dysphoric mood and suicidal ideas. The patient is not nervous/anxious.     Per HPI unless specifically indicated above     Objective:    BP 134/78 (BP Location: Left Arm, Patient Position: Sitting, Cuff Size: Normal)   Pulse 83   Temp 97.7 F (36.5 C) (Other (Comment))   Ht 5\' 5"  (1.651 m)   Wt 149 lb (67.6 kg)   SpO2 97%   BMI 24.79 kg/m   Wt Readings from Last 3 Encounters:  12/10/17 149 lb (67.6 kg)  05/17/17 154 lb 12 oz (70.2 kg)  01/09/17 153 lb 4 oz (69.5 kg)    Physical Exam  Constitutional: She is oriented to person, place, and time. She appears well-developed and well-nourished.  HENT:  Head: Normocephalic and atraumatic.  Neck: Neck supple.  Cardiovascular: Normal rate and regular rhythm.  Pulmonary/Chest: Effort normal and breath sounds normal.  Abdominal: Soft. Bowel sounds are normal. She exhibits no mass. There is no hepatosplenomegaly. There is no tenderness.  Musculoskeletal: She exhibits no edema.  Lymphadenopathy:    She has no cervical adenopathy.  Neurological: She is alert and oriented to person, place, and time.  Skin: Skin is warm and dry.  Psychiatric: She has a normal mood and affect. Her behavior is normal.  Vitals reviewed.       Assessment & Plan:   Encounter Diagnoses  Name Primary?  . Hyperlipidemia, unspecified hyperlipidemia type Yes  . Postablative hypothyroidism   . Cigarette nicotine dependence with nicotine-induced disorder     -Check labs.  Will call with results -pt to Follow up 3 months.  Pt can call to cancel appointment when she gets her insurance.  RTO sooner prn

## 2017-12-13 ENCOUNTER — Other Ambulatory Visit: Payer: Self-pay | Admitting: Physician Assistant

## 2018-01-06 ENCOUNTER — Other Ambulatory Visit: Payer: Self-pay | Admitting: Physician Assistant

## 2018-01-09 ENCOUNTER — Telehealth: Payer: Self-pay | Admitting: Student

## 2018-01-09 NOTE — Telephone Encounter (Signed)
Pt called stating she took mucinex for chest congestion and had a bad reaction to it. Pt states her hear was racing and felt dizzy headed with this lasting a day and a half. Pt states when she lies at night the coughing is worse. Pt wants advice on what other OTC med she can take.  LPN advised pt to try OTC robitussin to help with cough and to use extra pillows to keep her propped up. Pt verbalized understanding.

## 2018-03-08 ENCOUNTER — Other Ambulatory Visit: Payer: Self-pay | Admitting: Physician Assistant

## 2018-03-12 ENCOUNTER — Ambulatory Visit: Payer: Medicaid Other | Admitting: Physician Assistant

## 2018-04-02 ENCOUNTER — Other Ambulatory Visit: Payer: Self-pay | Admitting: Physician Assistant

## 2018-04-03 ENCOUNTER — Encounter: Payer: Self-pay | Admitting: Physician Assistant

## 2018-05-01 ENCOUNTER — Other Ambulatory Visit: Payer: Self-pay | Admitting: Physician Assistant

## 2018-05-08 ENCOUNTER — Other Ambulatory Visit (HOSPITAL_COMMUNITY)
Admission: RE | Admit: 2018-05-08 | Discharge: 2018-05-08 | Disposition: A | Discharge status: Home / Self Care | Payer: Medicaid Other | Source: Ambulatory Visit | Attending: Physician Assistant | Admitting: Physician Assistant

## 2018-05-08 ENCOUNTER — Encounter: Payer: Self-pay | Admitting: Physician Assistant

## 2018-05-08 ENCOUNTER — Other Ambulatory Visit: Payer: Self-pay | Admitting: Physician Assistant

## 2018-05-08 ENCOUNTER — Ambulatory Visit: Payer: Medicaid Other | Admitting: Physician Assistant

## 2018-05-08 VITALS — BP 134/80 | HR 96 | Temp 97.9°F | Ht 65.0 in | Wt 139.0 lb

## 2018-05-08 DIAGNOSIS — E890 Postprocedural hypothyroidism: Secondary | ICD-10-CM

## 2018-05-08 DIAGNOSIS — Z5320 Procedure and treatment not carried out because of patient's decision for unspecified reasons: Secondary | ICD-10-CM

## 2018-05-08 DIAGNOSIS — E785 Hyperlipidemia, unspecified: Secondary | ICD-10-CM

## 2018-05-08 DIAGNOSIS — F17200 Nicotine dependence, unspecified, uncomplicated: Secondary | ICD-10-CM

## 2018-05-08 LAB — COMPREHENSIVE METABOLIC PANEL
ALBUMIN: 4.6 g/dL (ref 3.5–5.0)
ALT: 48 U/L — AB (ref 0–44)
AST: 43 U/L — AB (ref 15–41)
Alkaline Phosphatase: 76 U/L (ref 38–126)
Anion gap: 10 (ref 5–15)
BUN: 13 mg/dL (ref 6–20)
CO2: 26 mmol/L (ref 22–32)
CREATININE: 0.69 mg/dL (ref 0.44–1.00)
Calcium: 9.6 mg/dL (ref 8.9–10.3)
Chloride: 102 mmol/L (ref 98–111)
GFR calc Af Amer: >60 mL/min (ref >60)
GLUCOSE: 92 mg/dL (ref 70–99)
Potassium: 3.7 mmol/L (ref 3.5–5.1)
Sodium: 138 mmol/L (ref 135–145)
Total Bilirubin: 1 mg/dL (ref 0.3–1.2)
Total Protein: 8.3 g/dL — ABNORMAL HIGH (ref 6.5–8.1)

## 2018-05-08 LAB — LIPID PANEL
Cholesterol: 267 mg/dL — ABNORMAL HIGH (ref 0–200)
HDL: 68 mg/dL (ref >40)
LDL Cholesterol: 174 mg/dL — ABNORMAL HIGH (ref 0–99)
TRIGLYCERIDES: 125 mg/dL (ref <150)
Total CHOL/HDL Ratio: 3.9 RATIO
VLDL: 25 mg/dL (ref 0–40)

## 2018-05-08 LAB — TSH: TSH: 0.782 u[IU]/mL (ref 0.350–4.500)

## 2018-05-08 MED ORDER — COLESEVELAM HCL 625 MG PO TABS: 1875.0000 mg | ORAL_TABLET | Freq: Two times a day (BID) | ORAL | 3 refills | Status: DC

## 2018-05-08 MED ORDER — LEVOTHYROXINE SODIUM 112 MCG PO TABS: 112.0000 ug | ORAL_TABLET | Freq: Every day | ORAL | 3 refills | Status: DC

## 2018-05-08 NOTE — Progress Notes (Signed)
BP 134/80 (BP Location: Left Arm, Patient Position: Sitting, Cuff Size: Normal)   Pulse 96   Temp 97.9 F (36.6 C)   Ht 5\' 5"  (1.651 m)   Wt 139 lb (63 kg)   SpO2 96%   BMI 23.13 kg/m    Subjective:    Patient ID: Tara Lewis, female    DOB: [birth year], 53 y.o.   MRN: [id number]  HPI: Tara Lewis is a 53 y.o. female presenting on 05/08/2018 for Follow-up   HPI    Pt says she didn't get insurance in october as expected.  She is now expecting to get it in may.  She is doing well and has no issues today.      Relevant past medical, surgical, family and social history reviewed and updated as indicated. Interim medical history since our last visit reviewed. Allergies and medications reviewed and updated.    Current Outpatient Medications:  .  colesevelam (WELCHOL) 625 MG tablet, Take 3 tablets (1,875 mg total) by mouth 2 (two) times daily with a meal. (Patient taking differently: Take 625 mg by mouth 2 (two) times daily with a meal. ), Disp: 540 tablet, Rfl: 0 .  levothyroxine (SYNTHROID, LEVOTHROID) 112 MCG tablet, TAKE 1 TABLET BY MOUTH EVERY DAY, Disp: 30 tablet, Rfl: 0   Review of Systems  Constitutional: Negative for appetite change, chills, diaphoresis, fatigue, fever and unexpected weight change.  HENT: Negative for congestion, dental problem, drooling, ear pain, facial swelling, hearing loss, mouth sores, sneezing, sore throat, trouble swallowing and voice change.   Eyes: Negative for pain, discharge, redness, itching and visual disturbance.  Respiratory: Negative for cough, choking, shortness of breath and wheezing.   Cardiovascular: Negative for chest pain, palpitations and leg swelling.  Gastrointestinal: Negative for abdominal pain, blood in stool, constipation, diarrhea and vomiting.  Endocrine: Negative for cold intolerance, heat intolerance and polydipsia.  Genitourinary: Negative for decreased urine volume, dysuria and hematuria.   Musculoskeletal: Positive for arthralgias and back pain. Negative for gait problem.  Skin: Negative for rash.  Allergic/Immunologic: Positive for environmental allergies.  Neurological: Negative for seizures, syncope, light-headedness and headaches.  Hematological: Negative for adenopathy.  Psychiatric/Behavioral: Negative for agitation, dysphoric mood and suicidal ideas. The patient is nervous/anxious.     Per HPI unless specifically indicated above     Objective:    BP 134/80 (BP Location: Left Arm, Patient Position: Sitting, Cuff Size: Normal)   Pulse 96   Temp 97.9 F (36.6 C)   Ht 5\' 5"  (1.651 m)   Wt 139 lb (63 kg)   SpO2 96%   BMI 23.13 kg/m   Wt Readings from Last 3 Encounters:  05/08/18 139 lb (63 kg)  12/10/17 149 lb (67.6 kg)  05/17/17 154 lb 12 oz (70.2 kg)    Physical Exam Vitals signs reviewed.  Constitutional:      Appearance: She is well-developed.  HENT:     Head: Normocephalic and atraumatic.  Neck:     Musculoskeletal: Neck supple.  Cardiovascular:     Rate and Rhythm: Normal rate and regular rhythm.  Pulmonary:     Effort: Pulmonary effort is normal.     Breath sounds: Normal breath sounds.  Abdominal:     General: Bowel sounds are normal.     Palpations: Abdomen is soft. There is no mass.     Tenderness: There is no abdominal tenderness.  Lymphadenopathy:     Cervical: No cervical adenopathy.  Skin:    General: Skin is warm  and dry.  Neurological:     Mental Status: She is alert and oriented to person, place, and time.  Psychiatric:        Behavior: Behavior normal.         Assessment & Plan:   Encounter Diagnoses  Name Primary?  . Hyperlipidemia, unspecified hyperlipidemia type Yes  . Postablative hypothyroidism   . Tobacco use disorder   . Screening mammography declined      -Pt declined  Screening mammogram -Pt declined iFOBT and will plan to get colonoscopy when she gets insurance -will Update labs and call pt with  results -Will send rx to [facility] in [place] after reviewing results -counseled smoking cessation -pt to follow up  3 months.  RTO sooner prn

## 2018-07-10 ENCOUNTER — Other Ambulatory Visit (HOSPITAL_COMMUNITY)
Admission: RE | Admit: 2018-07-10 | Discharge: 2018-07-10 | Disposition: A | Discharge status: Home / Self Care | Payer: Medicaid Other | Source: Ambulatory Visit | Attending: Physician Assistant | Admitting: Physician Assistant

## 2018-07-10 ENCOUNTER — Ambulatory Visit (HOSPITAL_COMMUNITY)
Admission: RE | Admit: 2018-07-10 | Discharge: 2018-07-10 | Disposition: A | Discharge status: Home / Self Care | Payer: Medicaid Other | Source: Ambulatory Visit | Attending: Physician Assistant | Admitting: Physician Assistant

## 2018-07-10 ENCOUNTER — Other Ambulatory Visit: Payer: Self-pay

## 2018-07-10 ENCOUNTER — Encounter: Payer: Self-pay | Admitting: Physician Assistant

## 2018-07-10 ENCOUNTER — Ambulatory Visit: Payer: Medicaid Other | Admitting: Physician Assistant

## 2018-07-10 VITALS — BP 116/78 | HR 103 | Temp 99.7°F | Resp 20

## 2018-07-10 DIAGNOSIS — R059 Cough, unspecified: Secondary | ICD-10-CM

## 2018-07-10 DIAGNOSIS — R05 Cough: Secondary | ICD-10-CM | POA: Insufficient documentation

## 2018-07-10 DIAGNOSIS — R0602 Shortness of breath: Secondary | ICD-10-CM

## 2018-07-10 DIAGNOSIS — R509 Fever, unspecified: Secondary | ICD-10-CM

## 2018-07-10 LAB — INFLUENZA PANEL BY PCR (TYPE A & B)
Influenza A By PCR: POSITIVE — AB
Influenza B By PCR: NEGATIVE

## 2018-07-10 MED ORDER — IPRATROPIUM-ALBUTEROL 0.5-2.5 (3) MG/3ML IN SOLN
3.0000 mL | Freq: Once | RESPIRATORY_TRACT | Status: AC
  Administered 2018-07-10: 3 mL via RESPIRATORY_TRACT

## 2018-07-10 MED ORDER — ALBUTEROL SULFATE HFA 108 (90 BASE) MCG/ACT IN AERS: 2.0000 | INHALATION_SPRAY | Freq: Four times a day (QID) | RESPIRATORY_TRACT | 7 refills | Status: DC | PRN

## 2018-07-10 NOTE — Progress Notes (Signed)
BP 116/78 (BP Location: Left Arm, Patient Position: Sitting, Cuff Size: Normal)   Pulse (!) 103   Temp 99.7 F (37.6 C)   Resp 20   SpO2 95%    Subjective:    Patient ID: Tara Lewis, female    DOB: 1965/06/16, 53 y.o.   MRN: [id number]  HPI: Tara Lewis is a 53 y.o. female presenting on 07/10/2018 for Cough (pt states temp this morning was 100.6 pt states she has not taken anything today. last tylenol last night around 8:30pm when her temp was 101.9. pt c/o cough, sore throat, chills, diarrhea, HA, congestion, ear pressure.)   HPI   Chief Complaint  Patient presents with  . Cough    pt states temp this morning was 100.6 pt states she has not taken anything today. last tylenol last night around 8:30pm when her temp was 101.9. pt c/o cough, sore throat, chills, diarrhea, HA, congestion, ear pressure.     Pt says no smoking/No cigarettes in 4 days.  She says her Sickness started Sunday.    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  She says her work made her work even though she was running a fever.  (Sunday and 1/2 day on Monday.  )    She did 24 haircuts on Sunday.    No known sick contacts.  No travel outside of [place] in the past month.   Pt is unaware of being in contact with anyone who has coronavirus but says she is around lots of people at work.   + body aches.  She did not get the flu shot.    Today is her 3rd day of fevers.     Relevant past medical, surgical, family and social history reviewed and updated as indicated. Interim medical history since our last visit reviewed. Allergies and medications reviewed and updated.   Current Outpatient Medications:  .  colesevelam (WELCHOL) 625 MG tablet, Take 3 tablets (1,875 mg total) by mouth 2 (two) times daily with a meal., Disp: 540 tablet, Rfl: 3 .  levothyroxine (SYNTHROID, LEVOTHROID) 112 MCG tablet, Take 1 tablet (112 mcg total) by mouth daily., Disp: 30 tablet, Rfl: 3    Review of  Systems  Constitutional: Positive for appetite change, chills, diaphoresis, fatigue and fever. Negative for unexpected weight change.  HENT: Positive for congestion, ear pain, sneezing and sore throat. Negative for dental problem, drooling, facial swelling, hearing loss, mouth sores, trouble swallowing and voice change.   Eyes: Negative for pain, discharge, redness, itching and visual disturbance.  Respiratory: Positive for cough, shortness of breath and wheezing. Negative for choking.   Cardiovascular: Negative for chest pain, palpitations and leg swelling.  Gastrointestinal: Positive for abdominal pain, diarrhea and vomiting. Negative for blood in stool and constipation.  Endocrine: Positive for cold intolerance and polydipsia. Negative for heat intolerance.  Genitourinary: Negative for decreased urine volume, dysuria and hematuria.  Musculoskeletal: Positive for arthralgias. Negative for back pain and gait problem.  Skin: Negative for rash.  Allergic/Immunologic: Negative for environmental allergies.  Neurological: Positive for headaches. Negative for seizures, syncope and light-headedness.  Hematological: Negative for adenopathy.  Psychiatric/Behavioral: Negative for agitation, dysphoric mood and suicidal ideas. The patient is not nervous/anxious.     Per HPI unless specifically indicated above     Objective:    BP 116/78 (BP Location: Left Arm, Patient Position: Sitting, Cuff Size: Normal)   Pulse (!) 103   Temp 99.7 F (37.6 C)  Resp 20   SpO2 95%   Wt Readings from Last 3 Encounters:  05/08/18 139 lb (63 kg)  12/10/17 149 lb (67.6 kg)  05/17/17 154 lb 12 oz (70.2 kg)    Physical Exam Vitals signs reviewed.  Constitutional:      Appearance: She is well-developed.  HENT:     Head: Normocephalic and atraumatic.     Right Ear: Tympanic membrane normal.     Left Ear: Tympanic membrane normal.     Ears:     Comments: Pharynx not examined due to mask.  Strep not suspected.     Voice normal Neck:     Musculoskeletal: Neck supple.  Cardiovascular:     Rate and Rhythm: Normal rate and regular rhythm.  Pulmonary:     Effort: Pulmonary effort is normal. No respiratory distress.     Breath sounds: Wheezing present. No rhonchi or rales.  Abdominal:     General: Bowel sounds are normal.     Palpations: Abdomen is soft. There is no mass.     Tenderness: There is no abdominal tenderness.  Lymphadenopathy:     Cervical: No cervical adenopathy.  Skin:    General: Skin is warm and dry.  Neurological:     Mental Status: She is alert and oriented to person, place, and time.  Psychiatric:        Behavior: Behavior normal.         Assessment & Plan:   Encounter Diagnoses  Name Primary?  . Fever, unspecified fever cause Yes  . SOB (shortness of breath)   . Cough     -Pt was given nebulizer treatment which says says helped.   -provider called and spoke with Chales Abrahams re current COVID-19 guidelines.   As pt has no travel or known exposures, she does not meet current testing criteria -pt is given rx for inhaler -she is to rest, push fluids, APAP or IBU prn, avoid smoking -she is counseled to self-quarantine to avoid spread of her febrile illness -flu test and CXR ordered -she is given application [facility] . -she is counseled to go to ER for worsening or new symptoms -pt to follow up here as scheduled

## 2018-08-14 ENCOUNTER — Ambulatory Visit: Payer: Medicaid Other | Admitting: Physician Assistant

## 2018-08-14 ENCOUNTER — Encounter: Payer: Self-pay | Admitting: Physician Assistant

## 2018-08-14 DIAGNOSIS — E785 Hyperlipidemia, unspecified: Secondary | ICD-10-CM

## 2018-08-14 DIAGNOSIS — E890 Postprocedural hypothyroidism: Secondary | ICD-10-CM

## 2018-08-14 NOTE — Progress Notes (Signed)
   There were no vitals taken for this visit.   Subjective:    Patient ID: Tara Lewis, female    DOB: 23-Apr-1966, 53 y.o.   MRN: [id number]  HPI: Tara Lewis is a 53 y.o. female presenting on 08/14/2018 for No chief complaint on file.   HPI   This is a telemedicine visit through [employer] due to coronavirus pandemic.  I connected with  Tara Lewis on 08/14/18 by a video enabled telemedicine application and verified that I am speaking with the correct person using two identifiers.   I discussed the limitations of evaluation and management by telemedicine. The patient expressed understanding and agreed to proceed.  Pt has quit smoking!  She says she hasn't smoked since she was seen for flu in March.    She is staying home as her work closed down [employer]).  She is eating healthy and is exercising daily.  She feels very good.    Relevant past medical, surgical, family and social history reviewed and updated as indicated. Interim medical history since our last visit reviewed. Allergies and medications reviewed and updated.   Current Outpatient Medications:  .  albuterol (PROVENTIL HFA;VENTOLIN HFA) 108 (90 Base) MCG/ACT inhaler, Inhale 2 puffs into the lungs every 6 (six) hours as needed for wheezing or shortness of breath., Disp: 1 Inhaler, Rfl: 7 .  colesevelam (WELCHOL) 625 MG tablet, Take 3 tablets (1,875 mg total) by mouth 2 (two) times daily with a meal., Disp: 540 tablet, Rfl: 3 .  levothyroxine (SYNTHROID, LEVOTHROID) 112 MCG tablet, Take 1 tablet (112 mcg total) by mouth daily., Disp: 30 tablet, Rfl: 3    Review of Systems  Per HPI unless specifically indicated above     Objective:    There were no vitals taken for this visit.  Wt Readings from Last 3 Encounters:  05/08/18 139 lb (63 kg)  12/10/17 149 lb (67.6 kg)  05/17/17 154 lb 12 oz (70.2 kg)    Physical Exam Constitutional:      Appearance: Normal appearance.  HENT:     Head: Normocephalic  and atraumatic.  Pulmonary:     Effort: Pulmonary effort is normal. No respiratory distress.     Comments: Pt speaks in complete sentences without sob and walks around her home without difficulty.  Neurological:     Mental Status: She is alert and oriented to person, place, and time.  Psychiatric:        Mood and Affect: Mood normal.        Behavior: Behavior normal.         Assessment & Plan:   Encounter Diagnoses  Name Primary?  . Hyperlipidemia, unspecified hyperlipidemia type Yes  . Postablative hypothyroidism     -pt congratulated on stopping smoking and she is encouraged to keep up the good work! -pt to continue current medications and lowfat diet.  She is also encouraged to continue regular exercise -will defer labs at this time -pt to follow up in 3 months.  She is to contact office sooner prn

## 2018-10-28 ENCOUNTER — Other Ambulatory Visit: Payer: Self-pay | Admitting: Physician Assistant

## 2018-10-28 DIAGNOSIS — E890 Postprocedural hypothyroidism: Secondary | ICD-10-CM

## 2018-10-28 DIAGNOSIS — E785 Hyperlipidemia, unspecified: Secondary | ICD-10-CM

## 2018-10-31 ENCOUNTER — Other Ambulatory Visit: Payer: Self-pay

## 2018-10-31 ENCOUNTER — Other Ambulatory Visit (HOSPITAL_COMMUNITY)
Admission: RE | Admit: 2018-10-31 | Discharge: 2018-10-31 | Disposition: A | Discharge status: Home / Self Care | Payer: Medicaid Other | Source: Ambulatory Visit | Attending: Physician Assistant | Admitting: Physician Assistant

## 2018-10-31 DIAGNOSIS — E890 Postprocedural hypothyroidism: Secondary | ICD-10-CM | POA: Insufficient documentation

## 2018-10-31 DIAGNOSIS — E785 Hyperlipidemia, unspecified: Secondary | ICD-10-CM | POA: Insufficient documentation

## 2018-10-31 LAB — LIPID PANEL
Cholesterol: 231 mg/dL — ABNORMAL HIGH (ref 0–200)
HDL: 65 mg/dL (ref >40)
LDL Cholesterol: 136 mg/dL — ABNORMAL HIGH (ref 0–99)
Total CHOL/HDL Ratio: 3.6 RATIO
Triglycerides: 149 mg/dL (ref <150)
VLDL: 30 mg/dL (ref 0–40)

## 2018-10-31 LAB — COMPREHENSIVE METABOLIC PANEL
ALT: 15 U/L (ref 0–44)
AST: 18 U/L (ref 15–41)
Albumin: 4.4 g/dL (ref 3.5–5.0)
Alkaline Phosphatase: 55 U/L (ref 38–126)
Anion gap: 14 (ref 5–15)
BUN: 15 mg/dL (ref 6–20)
CO2: 25 mmol/L (ref 22–32)
Calcium: 9.4 mg/dL (ref 8.9–10.3)
Chloride: 104 mmol/L (ref 98–111)
Creatinine, Ser: 0.6 mg/dL (ref 0.44–1.00)
GFR calc Af Amer: >60 mL/min (ref >60)
GFR calc non Af Amer: >60 mL/min (ref >60)
Glucose, Bld: 84 mg/dL (ref 70–99)
Potassium: 3.5 mmol/L (ref 3.5–5.1)
Sodium: 143 mmol/L (ref 135–145)
Total Bilirubin: 0.9 mg/dL (ref 0.3–1.2)
Total Protein: 7.4 g/dL (ref 6.5–8.1)

## 2018-10-31 LAB — TSH: TSH: 1.243 u[IU]/mL (ref 0.350–4.500)

## 2018-11-05 ENCOUNTER — Encounter: Payer: Self-pay | Admitting: Physician Assistant

## 2018-11-05 ENCOUNTER — Ambulatory Visit: Payer: Medicaid Other | Admitting: Physician Assistant

## 2018-11-05 DIAGNOSIS — E890 Postprocedural hypothyroidism: Secondary | ICD-10-CM

## 2018-11-05 DIAGNOSIS — E785 Hyperlipidemia, unspecified: Secondary | ICD-10-CM

## 2018-11-05 DIAGNOSIS — Z1239 Encounter for other screening for malignant neoplasm of breast: Secondary | ICD-10-CM

## 2018-11-05 NOTE — Progress Notes (Signed)
There were no vitals taken for this visit.   Subjective:    Patient ID: Tara Lewis, female    DOB: January 24, 1966, 53 y.o.   MRN: [id number]  HPI: Tara Lewis is a 53 y.o. female presenting on 11/05/2018 for No chief complaint on file.   HPI   This is a telemedicine appointment through [employer] due to coronavirus pandemic  I connected with  Silvestre Gunner on 11/05/18 by a video enabled telemedicine application and verified that I am speaking with the correct person using two identifiers.   I discussed the limitations of evaluation and management by telemedicine. The patient expressed understanding and agreed to proceed.  Pt is at home.  Provider is at office  She is working at [employer].  She says she wears a mask while there and they sanitize between each customer.  She hasn't needed the inhaler since she had the flu several months ago  She picked up her cigarettes when she resumed work.  She has been quit again for 3 1/2 weeks now.  Pt hopes to get insurance after auruse  Relevant past medical, surgical, family and social history reviewed and updated as indicated. Interim medical history since our last visit reviewed. Allergies and medications reviewed and updated.   Current Outpatient Medications:  .  albuterol (PROVENTIL HFA;VENTOLIN HFA) 108 (90 Base) MCG/ACT inhaler, Inhale 2 puffs into the lungs every 6 (six) hours as needed for wheezing or shortness of breath., Disp: 1 Inhaler, Rfl: 7 .  colesevelam (WELCHOL) 625 MG tablet, Take 3 tablets (1,875 mg total) by mouth 2 (two) times daily with a meal., Disp: 540 tablet, Rfl: 3 .  levothyroxine (SYNTHROID, LEVOTHROID) 112 MCG tablet, Take 1 tablet (112 mcg total) by mouth daily., Disp: 30 tablet, Rfl: 3   Review of Systems  Per HPI unless specifically indicated above     Objective:    There were no vitals taken for this visit.  Wt Readings from Last 3 Encounters:  05/08/18 139 lb (63 kg)  12/10/17 149 lb  (67.6 kg)  05/17/17 154 lb 12 oz (70.2 kg)    Physical Exam Constitutional:      General: She is not in acute distress.    Appearance: Normal appearance. She is not ill-appearing.  HENT:     Head: Normocephalic and atraumatic.  Pulmonary:     Effort: Pulmonary effort is normal. No respiratory distress.  Neurological:     Mental Status: She is alert and oriented to person, place, and time.  Psychiatric:        Attention and Perception: Attention normal.        Mood and Affect: Mood normal.        Speech: Speech normal.        Behavior: Behavior normal. Behavior is cooperative.     Comments: Pt was smiling and in good spirits     Results for orders placed or performed during the hospital encounter of 10/31/18  TSH  Result Value Ref Range   TSH 1.243 0.350 - 4.500 uIU/mL  Lipid panel  Result Value Ref Range   Cholesterol 231 (H) 0 - 200 mg/dL   Triglycerides 149 <150 mg/dL   HDL 65 >40 mg/dL   Total CHOL/HDL Ratio 3.6 RATIO   VLDL 30 0 - 40 mg/dL   LDL Cholesterol 136 (H) 0 - 99 mg/dL  Comprehensive metabolic panel  Result Value Ref Range   Sodium 143 135 - 145 mmol/L   Potassium 3.5 3.5 -  5.1 mmol/L   Chloride 104 98 - 111 mmol/L   CO2 25 22 - 32 mmol/L   Glucose, Bld 84 70 - 99 mg/dL   BUN 15 6 - 20 mg/dL   Creatinine, Ser 1.610.60 0.44 - 1.00 mg/dL   Calcium 9.4 8.9 - 09.610.3 mg/dL   Total Protein 7.4 6.5 - 8.1 g/dL   Albumin 4.4 3.5 - 5.0 g/dL   AST 18 15 - 41 U/L   ALT 15 0 - 44 U/L   Alkaline Phosphatase 55 38 - 126 U/L   Total Bilirubin 0.9 0.3 - 1.2 mg/dL   GFR calc non Af Amer >60 >60 mL/min   GFR calc Af Amer >60 >60 mL/min   Anion gap 14 5 - 15      Assessment & Plan:   Encounter Diagnoses  Name Primary?  . Hyperlipidemia, unspecified hyperlipidemia type Yes  . Postablative hypothyroidism   . Screening for breast cancer      -reviewed labs with pt -pt to Continue current rx -encouraged pt to continue with smoking cessation -refer for Screening  mammogram -she is encouraged to continue to wear a mask when she is not at home -pt to follow up 3 months.  She is to contact office sooner prn.  She is to contact office if her insurance takes effect

## 2018-11-06 ENCOUNTER — Ambulatory Visit: Payer: Medicaid Other | Admitting: Physician Assistant

## 2018-11-08 ENCOUNTER — Other Ambulatory Visit: Payer: Self-pay | Admitting: Physician Assistant

## 2018-11-08 DIAGNOSIS — Z1239 Encounter for other screening for malignant neoplasm of breast: Secondary | ICD-10-CM

## 2018-11-25 ENCOUNTER — Encounter (HOSPITAL_COMMUNITY): Payer: Self-pay

## 2018-11-25 ENCOUNTER — Ambulatory Visit (HOSPITAL_COMMUNITY)
Admission: RE | Admit: 2018-11-25 | Discharge: 2018-11-25 | Disposition: A | Discharge status: Home / Self Care | Payer: Self-pay | Source: Ambulatory Visit | Attending: Physician Assistant | Admitting: Physician Assistant

## 2018-11-25 ENCOUNTER — Other Ambulatory Visit: Payer: Self-pay

## 2018-11-25 DIAGNOSIS — Z1239 Encounter for other screening for malignant neoplasm of breast: Secondary | ICD-10-CM | POA: Insufficient documentation

## 2018-12-18 ENCOUNTER — Other Ambulatory Visit: Payer: Self-pay | Admitting: Physician Assistant

## 2019-01-15 ENCOUNTER — Telehealth: Payer: Self-pay | Admitting: Student

## 2019-01-15 NOTE — Telephone Encounter (Signed)
Pt called today, 01-15-19 at 11am and left a voicemail on the nurse's line stating "I don't know if I'm suffering from depression. I'm crying all the time and I'm feeling overwhelmed like everything is crashing down and I just need someone to talk to to see what I should do".  LPN called pt back. 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 Pt denies SI, or of having thoughts of hurting herself or others. Pt states she just cries all the time.  LPN urged pt to go to ER if she feels she is in need of immediate attention. Pt states she does not think she needs to go to ER and feels she just needs to be on antidepressant. Pt states she was on lexapro in the past. LPN urges pt to contact [facility] for mental health services. Pt verbalized understanding.

## 2019-02-07 ENCOUNTER — Other Ambulatory Visit (HOSPITAL_COMMUNITY)
Admission: RE | Admit: 2019-02-07 | Discharge: 2019-02-07 | Disposition: A | Discharge status: Home / Self Care | Payer: Self-pay | Source: Ambulatory Visit | Attending: Physician Assistant | Admitting: Physician Assistant

## 2019-02-07 DIAGNOSIS — R69 Illness, unspecified: Secondary | ICD-10-CM | POA: Insufficient documentation

## 2019-02-10 ENCOUNTER — Other Ambulatory Visit: Payer: Self-pay | Admitting: Physician Assistant

## 2019-02-10 DIAGNOSIS — E890 Postprocedural hypothyroidism: Secondary | ICD-10-CM

## 2019-02-10 DIAGNOSIS — E785 Hyperlipidemia, unspecified: Secondary | ICD-10-CM

## 2019-02-11 ENCOUNTER — Ambulatory Visit: Payer: Medicaid Other | Admitting: Physician Assistant

## 2019-02-24 ENCOUNTER — Ambulatory Visit: Payer: Medicaid Other | Admitting: Physician Assistant

## 2019-02-24 ENCOUNTER — Encounter: Payer: Self-pay | Admitting: Physician Assistant

## 2019-02-24 DIAGNOSIS — L089 Local infection of the skin and subcutaneous tissue, unspecified: Secondary | ICD-10-CM

## 2019-02-24 MED ORDER — CEPHALEXIN 500 MG PO CAPS: 500.0000 mg | ORAL_CAPSULE | Freq: Four times a day (QID) | ORAL | 0 refills | Status: DC

## 2019-02-24 NOTE — Progress Notes (Signed)
There were no vitals taken for this visit.   Subjective:    Patient ID: Tara Lewis, female    DOB: Sep 13, 1965, 53 y.o.   MRN: [id number]  HPI: Tara Lewis is a 53 y.o. female presenting on 02/24/2019 for No chief complaint on file.   HPI  This is a telemedicine appointment through [employer] due to coronavirus pandemic.  I connected with  Silvestre Gunner on 02/24/19 by a video enabled telemedicine application and verified that I am speaking with the correct person using two identifiers.   I discussed the limitations of evaluation and management by telemedicine. The patient expressed understanding and agreed to proceed.  Patient is at home.  Provider is working at home office.  Patient is a Theme park manager.  She states that several days ago, she had a hair get into her mask and poked her in the lip. she says that on 2 days ago she started having swelling of the lip.   that was on Saturday.  She says that the swelling has continued.  She denies fevers.  She says she does feel a little chilly at times however.  She denies tooth pain.  Interestingly, patient says that at one point to remove the hair she had to pull it with tweezers.  This leads me to believe that this was not in fact a hair that was from her work environment having crawled underneath her mask.  As you know hair from surrounding area underneath her mask would not become embedded in her skin by itself.  Patient is not having any problems with breathing and she denies trouble swallowing.  Patient denies draining at the site of where the hair was embedded in her lip.   Relevant past medical, surgical, family and social history reviewed and updated as indicated. Interim medical history since our last visit reviewed. Allergies and medications reviewed and updated.   Current Outpatient Medications:  .  acetaminophen (TYLENOL) 500 MG tablet, Take 500 mg by mouth every 6 (six) hours as needed., Disp: , Rfl:  .  albuterol  (PROVENTIL HFA;VENTOLIN HFA) 108 (90 Base) MCG/ACT inhaler, Inhale 2 puffs into the lungs every 6 (six) hours as needed for wheezing or shortness of breath., Disp: 1 Inhaler, Rfl: 7 .  colesevelam (WELCHOL) 625 MG tablet, Take 3 tablets (1,875 mg total) by mouth 2 (two) times daily with a meal., Disp: 540 tablet, Rfl: 3 .  EUTHYROX 112 MCG tablet, Take 1 tablet by mouth once daily, Disp: 30 tablet, Rfl: 1   Review of Systems  Per HPI unless specifically indicated above     Objective:    There were no vitals taken for this visit.  Wt Readings from Last 3 Encounters:  05/08/18 139 lb (63 kg)  12/10/17 149 lb (67.6 kg)  05/17/17 154 lb 12 oz (70.2 kg)    Physical Exam Constitutional:      General: She is not in acute distress. HENT:     Head: Normocephalic and atraumatic.     Comments: There is swelling of the right side of the face around the lip area.   Pulmonary:     Effort: No respiratory distress.  Neurological:     Mental Status: She is alert and oriented to person, place, and time.  Psychiatric:        Attention and Perception: Attention normal.        Speech: Speech normal.        Behavior: Behavior is cooperative.  Assessment & Plan:      Encounter Diagnosis  Name Primary?  . Infection of skin Yes    -rx keflex -RTW Thursday.  Patient will be given note to return to work on Thursday.  She is told that she can provide a fax number and we can fax the note or she can stop by the office to pick the return to work note up. -Pt to notify office if she worsens.  She is to notify office if she isn't improving in 48 hours.   Pt to go to ER for problems with breathing or swallowing or for significant worsening or other problems.

## 2019-03-07 ENCOUNTER — Other Ambulatory Visit: Payer: Self-pay | Admitting: Physician Assistant

## 2019-04-15 ENCOUNTER — Other Ambulatory Visit: Payer: Self-pay | Admitting: Physician Assistant

## 2019-05-13 ENCOUNTER — Other Ambulatory Visit: Payer: Self-pay | Admitting: Physician Assistant

## 2019-05-28 ENCOUNTER — Other Ambulatory Visit: Payer: Self-pay | Admitting: Physician Assistant

## 2019-06-02 ENCOUNTER — Other Ambulatory Visit: Payer: Self-pay | Admitting: Physician Assistant

## 2019-06-02 MED ORDER — LEVOTHYROXINE SODIUM 112 MCG PO TABS: 112.0000 ug | ORAL_TABLET | Freq: Every day | ORAL | 0 refills | Status: DC

## 2019-06-03 ENCOUNTER — Other Ambulatory Visit (HOSPITAL_COMMUNITY)
Admission: RE | Admit: 2019-06-03 | Discharge: 2019-06-03 | Disposition: A | Discharge status: Home / Self Care | Payer: Medicaid Other | Source: Ambulatory Visit | Attending: Physician Assistant | Admitting: Physician Assistant

## 2019-06-03 DIAGNOSIS — E890 Postprocedural hypothyroidism: Secondary | ICD-10-CM | POA: Insufficient documentation

## 2019-06-03 DIAGNOSIS — E785 Hyperlipidemia, unspecified: Secondary | ICD-10-CM | POA: Insufficient documentation

## 2019-06-03 LAB — COMPREHENSIVE METABOLIC PANEL
ALT: 27 U/L (ref 0–44)
AST: 31 U/L (ref 15–41)
Albumin: 4.2 g/dL (ref 3.5–5.0)
Alkaline Phosphatase: 54 U/L (ref 38–126)
Anion gap: 10 (ref 5–15)
BUN: 23 mg/dL — ABNORMAL HIGH (ref 6–20)
CO2: 31 mmol/L (ref 22–32)
Calcium: 9.5 mg/dL (ref 8.9–10.3)
Chloride: 100 mmol/L (ref 98–111)
Creatinine, Ser: 0.73 mg/dL (ref 0.44–1.00)
GFR calc Af Amer: >60 mL/min (ref >60)
GFR calc non Af Amer: >60 mL/min (ref >60)
Glucose, Bld: 92 mg/dL (ref 70–99)
Potassium: 4.4 mmol/L (ref 3.5–5.1)
Sodium: 141 mmol/L (ref 135–145)
Total Bilirubin: 0.7 mg/dL (ref 0.3–1.2)
Total Protein: 7.7 g/dL (ref 6.5–8.1)

## 2019-06-03 LAB — LIPID PANEL
Cholesterol: 281 mg/dL — ABNORMAL HIGH (ref 0–200)
HDL: 91 mg/dL (ref >40)
LDL Cholesterol: 155 mg/dL — ABNORMAL HIGH (ref 0–99)
Total CHOL/HDL Ratio: 3.1 RATIO
Triglycerides: 177 mg/dL — ABNORMAL HIGH (ref <150)
VLDL: 35 mg/dL (ref 0–40)

## 2019-06-03 LAB — TSH: TSH: 0.338 u[IU]/mL — ABNORMAL LOW (ref 0.350–4.500)

## 2019-06-04 ENCOUNTER — Encounter: Payer: Self-pay | Admitting: Physician Assistant

## 2019-06-04 ENCOUNTER — Ambulatory Visit: Payer: Medicaid Other | Admitting: Physician Assistant

## 2019-06-04 DIAGNOSIS — E785 Hyperlipidemia, unspecified: Secondary | ICD-10-CM

## 2019-06-04 DIAGNOSIS — R519 Headache, unspecified: Secondary | ICD-10-CM

## 2019-06-04 DIAGNOSIS — F17200 Nicotine dependence, unspecified, uncomplicated: Secondary | ICD-10-CM

## 2019-06-04 DIAGNOSIS — M542 Cervicalgia: Secondary | ICD-10-CM

## 2019-06-04 DIAGNOSIS — E890 Postprocedural hypothyroidism: Secondary | ICD-10-CM

## 2019-06-04 MED ORDER — LEVOTHYROXINE SODIUM 100 MCG PO TABS: 100.0000 ug | ORAL_TABLET | Freq: Every day | ORAL | 3 refills | Status: DC

## 2019-06-04 NOTE — Progress Notes (Signed)
There were no vitals taken for this visit.   Subjective:    Patient ID: Tara Lewis, female    DOB: Aug 02, 1965, 54 y.o.   MRN: [id number]  HPI: Tara Lewis is a 54 y.o. female presenting on 06/04/2019 for No chief complaint on file.   HPI   This is a telemedicine appointemnt through [employer] due to coronavirus pandemic  I connected with  Tara Lewis on 06/04/19 by a video enabled telemedicine application and verified that I am speaking with the correct person using two identifiers.   I discussed the limitations of evaluation and management by telemedicine. The patient expressed understanding and agreed to proceed.  Pt is at home.  Provider is at office.   Pt is 53yoF with dyslipidemia and hypothyroidism.   Pt has started smoking again.    Pt switched jobs-  She now works at  [employer] in [place].  She hasn't got insurance but still hope to get it at some point  Pt says she went ER for HA - went to [facility].    She says she Got put on prednisone and phenergan.   Pt feels like blurred vision.  She is not really having HA but back of neck pain.    HA x 2 wk-   neck pain started just before HA.    Pt says she was told in ER that her BP was good.    She is feeling a bit anxious or jittery- when asked due to TSH levels.   For a couple months.    Eye exam with new glasses about 6 month ago or more recent.  Pt says prescription changed significantly.       Relevant past medical, surgical, family and social history reviewed and updated as indicated. Interim medical history since our last visit reviewed. Allergies and medications reviewed and updated.     Current Outpatient Medications:  .  colesevelam (WELCHOL) 625 MG tablet, Take 3 tablets (1,875 mg total) by mouth 2 (two) times daily with a meal., Disp: 540 tablet, Rfl: 3 .  levothyroxine (EUTHYROX) 112 MCG tablet, Take 1 tablet (112 mcg total) by mouth daily., Disp: 30 tablet, Rfl: 0 .  naproxen sodium (ALEVE)  220 MG tablet, Take 220 mg by mouth., Disp: , Rfl:  .  PREDNISONE PO, Take by mouth., Disp: , Rfl:  .  promethazine (PHENERGAN) 25 MG tablet, Take 25 mg by mouth every 6 (six) hours as needed for nausea or vomiting., Disp: , Rfl:  .  albuterol (PROVENTIL HFA;VENTOLIN HFA) 108 (90 Base) MCG/ACT inhaler, Inhale 2 puffs into the lungs every 6 (six) hours as needed for wheezing or shortness of breath. (Patient not taking: Reported on 06/04/2019), Disp: 1 Inhaler, Rfl: 7    Review of Systems  Per HPI unless specifically indicated above     Objective:    There were no vitals taken for this visit.  Wt Readings from Last 3 Encounters:  05/08/18 139 lb (63 kg)  12/10/17 149 lb (67.6 kg)  05/17/17 154 lb 12 oz (70.2 kg)    Physical Exam Constitutional:      General: She is not in acute distress.    Appearance: Normal appearance. She is normal weight. She is not ill-appearing or toxic-appearing.  HENT:     Head: Normocephalic and atraumatic.  Eyes:     Extraocular Movements: Extraocular movements intact.     Conjunctiva/sclera: Conjunctivae normal.     Pupils: Pupils are equal, round, and reactive to  light.  Pulmonary:     Effort: Pulmonary effort is normal. No respiratory distress.  Neurological:     Mental Status: She is alert and oriented to person, place, and time.     Cranial Nerves: No facial asymmetry.  Psychiatric:        Attention and Perception: Attention normal.        Mood and Affect: Mood normal.        Speech: Speech normal.        Behavior: Behavior normal. Behavior is cooperative.     Results for orders placed or performed during the hospital encounter of 06/03/19  Comprehensive metabolic panel  Result Value Ref Range   Sodium 141 135 - 145 mmol/L   Potassium 4.4 3.5 - 5.1 mmol/L   Chloride 100 98 - 111 mmol/L   CO2 31 22 - 32 mmol/L   Glucose, Bld 92 70 - 99 mg/dL   BUN 23 (H) 6 - 20 mg/dL   Creatinine, Ser 0.73 0.44 - 1.00 mg/dL   Calcium 9.5 8.9 - 10.3 mg/dL    Total Protein 7.7 6.5 - 8.1 g/dL   Albumin 4.2 3.5 - 5.0 g/dL   AST 31 15 - 41 U/L   ALT 27 0 - 44 U/L   Alkaline Phosphatase 54 38 - 126 U/L   Total Bilirubin 0.7 0.3 - 1.2 mg/dL   GFR calc non Af Amer >60 >60 mL/min   GFR calc Af Amer >60 >60 mL/min   Anion gap 10 5 - 15  Lipid panel  Result Value Ref Range   Cholesterol 281 (H) 0 - 200 mg/dL   Triglycerides 177 (H) <150 mg/dL   HDL 91 >40 mg/dL   Total CHOL/HDL Ratio 3.1 RATIO   VLDL 35 0 - 40 mg/dL   LDL Cholesterol 155 (H) 0 - 99 mg/dL  TSH  Result Value Ref Range   TSH 0.338 (L) 0.350 - 4.500 uIU/mL      Assessment & Plan:    Encounter Diagnoses  Name Primary?  . Hyperlipidemia, unspecified hyperlipidemia type Yes  . Postablative hypothyroidism   . Tobacco use disorder   . Neck pain   . Nonintractable headache, unspecified chronicity pattern, unspecified headache type      1. hypothyroidism -Reviewed labs with pt -adjust synthroid dosage  2. Dyslipidemia -continue welchol (pt cannot take statins) and watch lowfat diet.  Will have nurse re-order welchol from PAP  3. 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 Tobacco Use Disorder -encouraged cessation   Follow up 3 months.  She is to contact office sooner for any problems, worsening new symptoms or if pain fails to resolve.

## 2019-07-03 ENCOUNTER — Encounter: Payer: Self-pay | Admitting: Physician Assistant

## 2019-09-02 ENCOUNTER — Ambulatory Visit: Payer: Medicaid Other | Admitting: Physician Assistant

## 2019-09-22 ENCOUNTER — Other Ambulatory Visit: Payer: Self-pay | Admitting: Physician Assistant

## 2019-09-22 MED ORDER — ALBUTEROL SULFATE HFA 108 (90 BASE) MCG/ACT IN AERS: 2.0000 | INHALATION_SPRAY | Freq: Four times a day (QID) | RESPIRATORY_TRACT | 0 refills | Status: DC | PRN

## 2019-10-03 ENCOUNTER — Other Ambulatory Visit (HOSPITAL_COMMUNITY)
Admission: RE | Admit: 2019-10-03 | Discharge: 2019-10-03 | Disposition: A | Discharge status: Home / Self Care | Payer: Medicaid Other | Source: Ambulatory Visit | Attending: Physician Assistant | Admitting: Physician Assistant

## 2019-10-03 DIAGNOSIS — E890 Postprocedural hypothyroidism: Secondary | ICD-10-CM

## 2019-10-03 DIAGNOSIS — E785 Hyperlipidemia, unspecified: Secondary | ICD-10-CM

## 2019-10-03 LAB — LIPID PANEL
Cholesterol: 184 mg/dL (ref 0–200)
HDL: 40 mg/dL — ABNORMAL LOW (ref >40)
LDL Cholesterol: 107 mg/dL — ABNORMAL HIGH (ref 0–99)
Total CHOL/HDL Ratio: 4.6 RATIO
Triglycerides: 184 mg/dL — ABNORMAL HIGH (ref <150)
VLDL: 37 mg/dL (ref 0–40)

## 2019-10-03 LAB — COMPREHENSIVE METABOLIC PANEL
ALT: 13 U/L (ref 0–44)
AST: 17 U/L (ref 15–41)
Albumin: 3.8 g/dL (ref 3.5–5.0)
Alkaline Phosphatase: 52 U/L (ref 38–126)
Anion gap: 12 (ref 5–15)
BUN: 11 mg/dL (ref 6–20)
CO2: 26 mmol/L (ref 22–32)
Calcium: 9.4 mg/dL (ref 8.9–10.3)
Chloride: 105 mmol/L (ref 98–111)
Creatinine, Ser: 0.63 mg/dL (ref 0.44–1.00)
GFR calc Af Amer: >60 mL/min (ref >60)
GFR calc non Af Amer: >60 mL/min (ref >60)
Glucose, Bld: 86 mg/dL (ref 70–99)
Potassium: 3.5 mmol/L (ref 3.5–5.1)
Sodium: 143 mmol/L (ref 135–145)
Total Bilirubin: 0.4 mg/dL (ref 0.3–1.2)
Total Protein: 7.5 g/dL (ref 6.5–8.1)

## 2019-10-03 LAB — TSH: TSH: 0.462 u[IU]/mL (ref 0.350–4.500)

## 2019-10-08 ENCOUNTER — Ambulatory Visit: Payer: Medicaid Other | Admitting: Physician Assistant

## 2019-10-08 VITALS — BP 140/92 | HR 79 | Temp 97.7°F | Wt 132.6 lb

## 2019-10-08 DIAGNOSIS — E785 Hyperlipidemia, unspecified: Secondary | ICD-10-CM

## 2019-10-08 DIAGNOSIS — E890 Postprocedural hypothyroidism: Secondary | ICD-10-CM

## 2019-10-08 DIAGNOSIS — F17200 Nicotine dependence, unspecified, uncomplicated: Secondary | ICD-10-CM

## 2019-10-08 NOTE — Patient Instructions (Signed)
DASH Eating Plan DASH stands for "Dietary Approaches to Stop Hypertension." The DASH eating plan is a healthy eating plan that has been shown to reduce high blood pressure (hypertension). It may also reduce your risk for type 2 diabetes, heart disease, and stroke. The DASH eating plan may also help with weight loss. What are tips for following this plan?  General guidelines  Avoid eating more than 2,300 mg (milligrams) of salt (sodium) a day. If you have hypertension, you may need to reduce your sodium intake to 1,500 mg a day.  Limit alcohol intake to no more than 1 drink a day for nonpregnant women and 2 drinks a day for men. One drink equals 12 oz of beer, 5 oz of wine, or 1 oz of hard liquor.  Work with your health care provider to maintain a healthy body weight or to lose weight. Ask what an ideal weight is for you.  Get at least 30 minutes of exercise that causes your heart to beat faster (aerobic exercise) most days of the week. Activities may include walking, swimming, or biking.  Work with your health care provider or diet and nutrition specialist (dietitian) to adjust your eating plan to your individual calorie needs. Reading food labels   Check food labels for the amount of sodium per serving. Choose foods with less than 5 percent of the Daily Value of sodium. Generally, foods with less than 300 mg of sodium per serving fit into this eating plan.  To find whole grains, look for the word "whole" as the first word in the ingredient list. Shopping  Buy products labeled as "low-sodium" or "no salt added."  Buy fresh foods. Avoid canned foods and premade or frozen meals. Cooking  Avoid adding salt when cooking. Use salt-free seasonings or herbs instead of table salt or sea salt. Check with your health care provider or pharmacist before using salt substitutes.  Do not fry foods. Cook foods using healthy methods such as baking, boiling, grilling, and broiling instead.  Cook with  heart-healthy oils, such as olive, canola, soybean, or sunflower oil. Meal planning  Eat a balanced diet that includes: ? 5 or more servings of fruits and vegetables each day. At each meal, try to fill half of your plate with fruits and vegetables. ? Up to 6-8 servings of whole grains each day. ? Less than 6 oz of lean meat, poultry, or fish each day. A 3-oz serving of meat is about the same size as a deck of cards. One egg equals 1 oz. ? 2 servings of low-fat dairy each day. ? A serving of nuts, seeds, or beans 5 times each week. ? Heart-healthy fats. Healthy fats called Omega-3 fatty acids are found in foods such as flaxseeds and coldwater fish, like sardines, salmon, and mackerel.  Limit how much you eat of the following: ? Canned or prepackaged foods. ? Food that is high in trans fat, such as fried foods. ? Food that is high in saturated fat, such as fatty meat. ? Sweets, desserts, sugary drinks, and other foods with added sugar. ? Full-fat dairy products.  Do not salt foods before eating.  Try to eat at least 2 vegetarian meals each week.  Eat more home-cooked food and less restaurant, buffet, and fast food.  When eating at a restaurant, ask that your food be prepared with less salt or no salt, if possible. What foods are recommended? The items listed may not be a complete list. Talk with your dietitian about   what dietary choices are best for you. Grains Whole-grain or whole-wheat bread. Whole-grain or whole-wheat pasta. Brown rice. Oatmeal. Quinoa. Bulgur. Whole-grain and low-sodium cereals. Pita bread. Low-fat, low-sodium crackers. Whole-wheat flour tortillas. Vegetables Fresh or frozen vegetables (raw, steamed, roasted, or grilled). Low-sodium or reduced-sodium tomato and vegetable juice. Low-sodium or reduced-sodium tomato sauce and tomato paste. Low-sodium or reduced-sodium canned vegetables. Fruits All fresh, dried, or frozen fruit. Canned fruit in natural juice (without  added sugar). Meat and other protein foods Skinless chicken or turkey. Ground chicken or turkey. Pork with fat trimmed off. Fish and seafood. Egg whites. Dried beans, peas, or lentils. Unsalted nuts, nut butters, and seeds. Unsalted canned beans. Lean cuts of beef with fat trimmed off. Low-sodium, lean deli meat. Dairy Low-fat (1%) or fat-free (skim) milk. Fat-free, low-fat, or reduced-fat cheeses. Nonfat, low-sodium ricotta or cottage cheese. Low-fat or nonfat yogurt. Low-fat, low-sodium cheese. Fats and oils Soft margarine without trans fats. Vegetable oil. Low-fat, reduced-fat, or light mayonnaise and salad dressings (reduced-sodium). Canola, safflower, olive, soybean, and sunflower oils. Avocado. Seasoning and other foods Herbs. Spices. Seasoning mixes without salt. Unsalted popcorn and pretzels. Fat-free sweets. What foods are not recommended? The items listed may not be a complete list. Talk with your dietitian about what dietary choices are best for you. Grains Baked goods made with fat, such as croissants, muffins, or some breads. Dry pasta or rice meal packs. Vegetables Creamed or fried vegetables. Vegetables in a cheese sauce. Regular canned vegetables (not low-sodium or reduced-sodium). Regular canned tomato sauce and paste (not low-sodium or reduced-sodium). Regular tomato and vegetable juice (not low-sodium or reduced-sodium). Pickles. Olives. Fruits Canned fruit in a light or heavy syrup. Fried fruit. Fruit in cream or butter sauce. Meat and other protein foods Fatty cuts of meat. Ribs. Fried meat. Bacon. Sausage. Bologna and other processed lunch meats. Salami. Fatback. Hotdogs. Bratwurst. Salted nuts and seeds. Canned beans with added salt. Canned or smoked fish. Whole eggs or egg yolks. Chicken or turkey with skin. Dairy Whole or 2% milk, cream, and half-and-half. Whole or full-fat cream cheese. Whole-fat or sweetened yogurt. Full-fat cheese. Nondairy creamers. Whipped toppings.  Processed cheese and cheese spreads. Fats and oils Butter. Stick margarine. Lard. Shortening. Ghee. Bacon fat. Tropical oils, such as coconut, palm kernel, or palm oil. Seasoning and other foods Salted popcorn and pretzels. Onion salt, garlic salt, seasoned salt, table salt, and sea salt. Worcestershire sauce. Tartar sauce. Barbecue sauce. Teriyaki sauce. Soy sauce, including reduced-sodium. Steak sauce. Canned and packaged gravies. Fish sauce. Oyster sauce. Cocktail sauce. Horseradish that you find on the shelf. Ketchup. Mustard. Meat flavorings and tenderizers. Bouillon cubes. Hot sauce and Tabasco sauce. Premade or packaged marinades. Premade or packaged taco seasonings. Relishes. Regular salad dressings. Where to find more information:  National Heart, Lung, and Blood Institute: www.nhlbi.nih.gov  American Heart Association: www.heart.org Summary  The DASH eating plan is a healthy eating plan that has been shown to reduce high blood pressure (hypertension). It may also reduce your risk for type 2 diabetes, heart disease, and stroke.  With the DASH eating plan, you should limit salt (sodium) intake to 2,300 mg a day. If you have hypertension, you may need to reduce your sodium intake to 1,500 mg a day.  When on the DASH eating plan, aim to eat more fresh fruits and vegetables, whole grains, lean proteins, low-fat dairy, and heart-healthy fats.  Work with your health care provider or diet and nutrition specialist (dietitian) to adjust your eating plan to your   individual calorie needs. This information is not intended to replace advice given to you by your health care provider. Make sure you discuss any questions you have with your health care provider. Document Revised: 03/30/2017 Document Reviewed: 04/10/2016 Elsevier Patient Education  2020 Elsevier Inc.  

## 2019-10-08 NOTE — Progress Notes (Signed)
BP (!) 140/92   Pulse 79   Temp 97.7 F (36.5 C)   Wt 132 lb 9.6 oz (60.1 kg)   SpO2 96%   BMI 22.07 kg/m    Subjective:    Patient ID: Tara Lewis, female    DOB: [birth year], 54 y.o.   MRN: [id number]  HPI: Tara Lewis is a 54 y.o. female presenting on 10/08/2019 for No chief complaint on file.   HPI    Pt had a negative covid 19 screening questionnaire.   Pt is a 54yoF with dyslipidemia and hypothyroidism.  She is still working at [employer] and is enjoying it.  She is expecting to be getting insurance soon.  She has no complaints today.   Relevant past medical, surgical, family and social history reviewed and updated as indicated. Interim medical history since our last visit reviewed. Allergies and medications reviewed and updated.   Current Outpatient Medications:  .  albuterol (VENTOLIN HFA) 108 (90 Base) MCG/ACT inhaler, Inhale 2 puffs into the lungs every 6 (six) hours as needed for wheezing or shortness of breath., Disp: 6.7 g, Rfl: 0 .  colesevelam (WELCHOL) 625 MG tablet, Take 3 tablets (1,875 mg total) by mouth 2 (two) times daily with a meal., Disp: 540 tablet, Rfl: 3 .  levothyroxine (SYNTHROID) 100 MCG tablet, Take 1 tablet (100 mcg total) by mouth daily., Disp: 30 tablet, Rfl: 3 .  naproxen sodium (ALEVE) 220 MG tablet, Take 220 mg by mouth., Disp: , Rfl:    Review of Systems  Per HPI unless specifically indicated above     Objective:    BP (!) 140/92   Pulse 79   Temp 97.7 F (36.5 C)   Wt 132 lb 9.6 oz (60.1 kg)   SpO2 96%   BMI 22.07 kg/m   Wt Readings from Last 3 Encounters:  10/08/19 132 lb 9.6 oz (60.1 kg)  05/08/18 139 lb (63 kg)  12/10/17 149 lb (67.6 kg)    Physical Exam Vitals reviewed.  Constitutional:      General: She is not in acute distress.    Appearance: Normal appearance. She is well-developed and normal weight. She is not ill-appearing.  HENT:     Head: Normocephalic and atraumatic.  Cardiovascular:     Rate  and Rhythm: Normal rate and regular rhythm.  Pulmonary:     Effort: Pulmonary effort is normal.     Breath sounds: Normal breath sounds.  Abdominal:     General: Bowel sounds are normal.     Palpations: Abdomen is soft. There is no mass.     Tenderness: There is no abdominal tenderness.  Musculoskeletal:     Cervical back: Neck supple.     Right lower leg: No edema.     Left lower leg: No edema.  Lymphadenopathy:     Cervical: No cervical adenopathy.  Skin:    General: Skin is warm and dry.  Neurological:     Mental Status: She is alert and oriented to person, place, and time.  Psychiatric:        Attention and Perception: Attention normal.        Mood and Affect: Mood normal.        Speech: Speech normal.        Behavior: Behavior normal. Behavior is cooperative.     Results for orders placed or performed during the hospital encounter of 10/03/19  Lipid panel  Result Value Ref Range   Cholesterol 184 0 - 200 mg/dL  Triglycerides 184 (H) <150 mg/dL   HDL 40 (L) >40 mg/dL   Total CHOL/HDL Ratio 4.6 RATIO   VLDL 37 0 - 40 mg/dL   LDL Cholesterol 107 (H) 0 - 99 mg/dL  Comprehensive metabolic panel  Result Value Ref Range   Sodium 143 135 - 145 mmol/L   Potassium 3.5 3.5 - 5.1 mmol/L   Chloride 105 98 - 111 mmol/L   CO2 26 22 - 32 mmol/L   Glucose, Bld 86 70 - 99 mg/dL   BUN 11 6 - 20 mg/dL   Creatinine, Ser 0.63 0.44 - 1.00 mg/dL   Calcium 9.4 8.9 - 10.3 mg/dL   Total Protein 7.5 6.5 - 8.1 g/dL   Albumin 3.8 3.5 - 5.0 g/dL   AST 17 15 - 41 U/L   ALT 13 0 - 44 U/L   Alkaline Phosphatase 52 38 - 126 U/L   Total Bilirubin 0.4 0.3 - 1.2 mg/dL   GFR calc non Af Amer >60 >60 mL/min   GFR calc Af Amer >60 >60 mL/min   Anion gap 12 5 - 15  TSH  Result Value Ref Range   TSH 0.462 0.350 - 4.500 uIU/mL      Assessment & Plan:   Encounter Diagnoses  Name Primary?  . Hyperlipidemia, unspecified hyperlipidemia type Yes  . Postablative hypothyroidism   . Tobacco use  disorder       -reviewed labs with pt -pt to continue current medications -encouraged smoking cessation -pt mammogram is due end of July/early August.  In light of pt expecting to have insurance in July, will discuss at next appointment (which would be if she ends up not getting insurance).  Pt is in agreement with plan. -will schedule pt to follow up in 3 months. She is to notify office if she gets insurance.  She with contact office sooner than the 3 months if needed

## 2019-10-09 ENCOUNTER — Encounter: Payer: Self-pay | Admitting: Physician Assistant

## 2019-11-19 ENCOUNTER — Other Ambulatory Visit: Payer: Self-pay | Admitting: Physician Assistant

## 2019-11-19 MED ORDER — LEVOTHYROXINE SODIUM 100 MCG PO TABS: 100.0000 ug | ORAL_TABLET | Freq: Every day | ORAL | 3 refills | Status: DC

## 2019-11-21 ENCOUNTER — Other Ambulatory Visit: Payer: Self-pay | Admitting: Physician Assistant

## 2019-12-30 ENCOUNTER — Other Ambulatory Visit: Payer: Self-pay | Admitting: Physician Assistant

## 2019-12-30 DIAGNOSIS — E785 Hyperlipidemia, unspecified: Secondary | ICD-10-CM

## 2019-12-30 DIAGNOSIS — E890 Postprocedural hypothyroidism: Secondary | ICD-10-CM

## 2020-01-14 ENCOUNTER — Ambulatory Visit: Payer: Medicaid Other | Admitting: Physician Assistant

## 2020-01-27 ENCOUNTER — Other Ambulatory Visit (HOSPITAL_COMMUNITY)
Admission: RE | Admit: 2020-01-27 | Discharge: 2020-01-27 | Disposition: A | Discharge status: Home / Self Care | Payer: Medicaid Other | Source: Ambulatory Visit | Attending: Physician Assistant | Admitting: Physician Assistant

## 2020-01-27 ENCOUNTER — Other Ambulatory Visit: Payer: Self-pay

## 2020-01-27 DIAGNOSIS — E890 Postprocedural hypothyroidism: Secondary | ICD-10-CM | POA: Insufficient documentation

## 2020-01-27 DIAGNOSIS — E785 Hyperlipidemia, unspecified: Secondary | ICD-10-CM

## 2020-01-27 LAB — COMPREHENSIVE METABOLIC PANEL WITH GFR
ALT: 10 U/L (ref 0–44)
AST: 16 U/L (ref 15–41)
Albumin: 4.1 g/dL (ref 3.5–5.0)
Alkaline Phosphatase: 51 U/L (ref 38–126)
Anion gap: 11 (ref 5–15)
BUN: 11 mg/dL (ref 6–20)
CO2: 25 mmol/L (ref 22–32)
Calcium: 9.3 mg/dL (ref 8.9–10.3)
Chloride: 104 mmol/L (ref 98–111)
Creatinine, Ser: 0.71 mg/dL (ref 0.44–1.00)
GFR calc Af Amer: >60 mL/min
GFR calc non Af Amer: >60 mL/min
Glucose, Bld: 85 mg/dL (ref 70–99)
Potassium: 3.9 mmol/L (ref 3.5–5.1)
Sodium: 140 mmol/L (ref 135–145)
Total Bilirubin: 1.1 mg/dL (ref 0.3–1.2)
Total Protein: 7.4 g/dL (ref 6.5–8.1)

## 2020-01-27 LAB — LIPID PANEL
Cholesterol: 247 mg/dL — ABNORMAL HIGH (ref 0–200)
HDL: 64 mg/dL (ref >40)
LDL Cholesterol: 115 mg/dL — ABNORMAL HIGH (ref 0–99)
Total CHOL/HDL Ratio: 3.9 RATIO
Triglycerides: 342 mg/dL — ABNORMAL HIGH (ref <150)
VLDL: 68 mg/dL — ABNORMAL HIGH (ref 0–40)

## 2020-01-27 LAB — TSH: TSH: 2.88 u[IU]/mL (ref 0.350–4.500)

## 2020-01-28 ENCOUNTER — Ambulatory Visit: Payer: Medicaid Other | Admitting: Physician Assistant

## 2020-01-28 ENCOUNTER — Encounter: Payer: Self-pay | Admitting: Physician Assistant

## 2020-01-28 ENCOUNTER — Other Ambulatory Visit: Payer: Self-pay

## 2020-01-28 VITALS — BP 148/90 | HR 85 | Temp 97.6°F | Ht 65.75 in | Wt 134.0 lb

## 2020-01-28 DIAGNOSIS — E785 Hyperlipidemia, unspecified: Secondary | ICD-10-CM

## 2020-01-28 DIAGNOSIS — E890 Postprocedural hypothyroidism: Secondary | ICD-10-CM

## 2020-01-28 DIAGNOSIS — M25562 Pain in left knee: Secondary | ICD-10-CM

## 2020-01-28 DIAGNOSIS — F17200 Nicotine dependence, unspecified, uncomplicated: Secondary | ICD-10-CM

## 2020-01-28 DIAGNOSIS — Z1239 Encounter for other screening for malignant neoplasm of breast: Secondary | ICD-10-CM

## 2020-01-28 DIAGNOSIS — R030 Elevated blood-pressure reading, without diagnosis of hypertension: Secondary | ICD-10-CM

## 2020-01-28 MED ORDER — COLESEVELAM HCL 625 MG PO TABS
1875.0000 mg | ORAL_TABLET | Freq: Two times a day (BID) | ORAL | 3 refills | Status: DC
Start: 2020-01-28 — End: 2020-07-21

## 2020-01-28 MED ORDER — LEVOTHYROXINE SODIUM 100 MCG PO TABS
100.0000 ug | ORAL_TABLET | Freq: Every day | ORAL | 4 refills | Status: DC
Start: 2020-01-28 — End: 2020-07-20

## 2020-01-28 NOTE — Progress Notes (Signed)
BP (!) 148/90   Pulse 85   Temp 97.6 F (36.4 C)   Ht 5' 5.75" (1.67 m)   Wt 134 lb (60.8 kg)   SpO2 96%   BMI 21.79 kg/m    Subjective:    Patient ID: Tara Lewis, female    DOB: [birth year], 54 y.o.   MRN: [id number]  HPI: Tara Lewis is a 54 y.o. female presenting on 01/28/2020 for Follow-up   HPI  Pt had a negative covid 19 screening questionnaire.   Pt is a 54yoF with routine appointment to follow up thyroid and cholesterol.  She now Works at [employer].  She is trying to get reclassified to be FT so she can get her benefits.  She says her BP is up today because she is really upset.  Her fiance of more than 3 years had his 5 week old grandbaby died 2 days ago.  SIDS is suspected.    Pt reports that she Larey Seat a couple months ago when she tried over her chiweenie.   She says her left knee is really bothering her increasingly over the past several weeks.  She reports a lot of swelling after she has been at work.  She wears good supportive shoes.   Pt not surprised triglycerides are up.  She says she eats "junk" at work.    Relevant past medical, surgical, family and social history reviewed and updated as indicated. Interim medical history since our last visit reviewed. Allergies and medications reviewed and updated.   Current Outpatient Medications:  .  albuterol (VENTOLIN HFA) 108 (90 Base) MCG/ACT inhaler, Inhale 2 puffs into the lungs every 6 (six) hours as needed for wheezing or shortness of breath., Disp: 6.7 g, Rfl: 0 .  colesevelam (WELCHOL) 625 MG tablet, Take 3 tablets (1,875 mg total) by mouth 2 (two) times daily with a meal., Disp: 540 tablet, Rfl: 3 .  EUTHYROX 100 MCG tablet, Take 1 tablet by mouth once daily, Disp: 30 tablet, Rfl: 2 .  naproxen sodium (ALEVE) 220 MG tablet, Take 220 mg by mouth., Disp: , Rfl:      Review of Systems  Per HPI unless specifically indicated above     Objective:    BP (!) 148/90   Pulse 85   Temp 97.6 F (36.4  C)   Ht 5' 5.75" (1.67 m)   Wt 134 lb (60.8 kg)   SpO2 96%   BMI 21.79 kg/m   Wt Readings from Last 3 Encounters:  01/28/20 134 lb (60.8 kg)  10/08/19 132 lb 9.6 oz (60.1 kg)  05/08/18 139 lb (63 kg)    Physical Exam Vitals reviewed.  Constitutional:      General: She is not in acute distress.    Appearance: She is well-developed and normal weight. She is not ill-appearing.  HENT:     Head: Normocephalic and atraumatic.  Cardiovascular:     Rate and Rhythm: Normal rate and regular rhythm.  Pulmonary:     Effort: Pulmonary effort is normal.     Breath sounds: Normal breath sounds.  Abdominal:     General: Bowel sounds are normal.     Palpations: Abdomen is soft. There is no mass.     Tenderness: There is no abdominal tenderness.  Musculoskeletal:     Cervical back: Neck supple.     Left knee: No swelling. Normal range of motion. Tenderness present.     Right lower leg: No edema.     Left lower leg:  No edema.     Comments: Tenderness lateral left knee.  There is a fresh scar over the left knee.  Lymphadenopathy:     Cervical: No cervical adenopathy.  Skin:    General: Skin is warm and dry.  Neurological:     Mental Status: She is alert and oriented to person, place, and time.  Psychiatric:        Behavior: Behavior normal.     Results for orders placed or performed during the hospital encounter of 01/27/20  TSH  Result Value Ref Range   TSH 2.880 0.350 - 4.500 uIU/mL  Lipid panel  Result Value Ref Range   Cholesterol 247 (H) 0 - 200 mg/dL   Triglycerides 159 (H) <150 mg/dL   HDL 64 >45 mg/dL   Total CHOL/HDL Ratio 3.9 RATIO   VLDL 68 (H) 0 - 40 mg/dL   LDL Cholesterol 859 (H) 0 - 99 mg/dL  Comprehensive metabolic panel  Result Value Ref Range   Sodium 140 135 - 145 mmol/L   Potassium 3.9 3.5 - 5.1 mmol/L   Chloride 104 98 - 111 mmol/L   CO2 25 22 - 32 mmol/L   Glucose, Bld 85 70 - 99 mg/dL   BUN 11 6 - 20 mg/dL   Creatinine, Ser 2.92 0.44 - 1.00 mg/dL    Calcium 9.3 8.9 - 44.6 mg/dL   Total Protein 7.4 6.5 - 8.1 g/dL   Albumin 4.1 3.5 - 5.0 g/dL   AST 16 15 - 41 U/L   ALT 10 0 - 44 U/L   Alkaline Phosphatase 51 38 - 126 U/L   Total Bilirubin 1.1 0.3 - 1.2 mg/dL   GFR calc non Af Amer >60 >60 mL/min   GFR calc Af Amer >60 >60 mL/min   Anion gap 11 5 - 15      Assessment & Plan:    Encounter Diagnoses  Name Primary?  . Hyperlipidemia, unspecified hyperlipidemia type Yes  . Postablative hypothyroidism   . Left knee pain, unspecified chronicity   . Tobacco use disorder   . Encounter for screening for malignant neoplasm of breast, unspecified screening modality   . Elevated blood pressure reading       -reviewed labs with pt -will refer for screening Mammogram -will get Xray knee.  Recommended ice the knee after work and elevate.  Elastic sleeve may help.  May refer to orthopedist -pt is given CAFA/application for [employer] financial assistance -pt counseled to watch lowfat diet -no changes to medications -discussed elevated bp.  Will monitor.  Likely due to stress of recent death.  Pt in agreement.   -pt to follow up 4 months.  She is to contact office sooner if needed or if she gets her insurance

## 2020-02-04 ENCOUNTER — Other Ambulatory Visit: Payer: Self-pay | Admitting: Student

## 2020-02-04 DIAGNOSIS — Z1239 Encounter for other screening for malignant neoplasm of breast: Secondary | ICD-10-CM

## 2020-02-26 ENCOUNTER — Ambulatory Visit (HOSPITAL_COMMUNITY): Payer: Medicaid Other

## 2020-02-26 ENCOUNTER — Encounter (HOSPITAL_COMMUNITY): Payer: Self-pay

## 2020-05-06 ENCOUNTER — Other Ambulatory Visit: Payer: Self-pay | Admitting: Physician Assistant

## 2020-05-06 DIAGNOSIS — E890 Postprocedural hypothyroidism: Secondary | ICD-10-CM

## 2020-05-06 DIAGNOSIS — E785 Hyperlipidemia, unspecified: Secondary | ICD-10-CM

## 2020-05-27 ENCOUNTER — Ambulatory Visit: Payer: Medicaid Other | Admitting: Physician Assistant

## 2020-07-19 ENCOUNTER — Other Ambulatory Visit (HOSPITAL_COMMUNITY)
Admission: RE | Admit: 2020-07-19 | Discharge: 2020-07-19 | Disposition: A | Discharge status: Home / Self Care | Payer: Medicaid Other | Source: Ambulatory Visit | Attending: Physician Assistant | Admitting: Physician Assistant

## 2020-07-19 ENCOUNTER — Other Ambulatory Visit: Payer: Self-pay

## 2020-07-19 DIAGNOSIS — E890 Postprocedural hypothyroidism: Secondary | ICD-10-CM | POA: Insufficient documentation

## 2020-07-19 DIAGNOSIS — E785 Hyperlipidemia, unspecified: Secondary | ICD-10-CM | POA: Insufficient documentation

## 2020-07-19 LAB — COMPREHENSIVE METABOLIC PANEL
ALT: 28 U/L (ref 0–44)
AST: 43 U/L — ABNORMAL HIGH (ref 15–41)
Albumin: 4.5 g/dL (ref 3.5–5.0)
Alkaline Phosphatase: 68 U/L (ref 38–126)
Anion gap: 13 (ref 5–15)
BUN: 12 mg/dL (ref 6–20)
CO2: 27 mmol/L (ref 22–32)
Calcium: 9.7 mg/dL (ref 8.9–10.3)
Chloride: 100 mmol/L (ref 98–111)
Creatinine, Ser: 0.68 mg/dL (ref 0.44–1.00)
GFR, Estimated: >60 mL/min (ref >60)
Glucose, Bld: 97 mg/dL (ref 70–99)
Potassium: 3.3 mmol/L — ABNORMAL LOW (ref 3.5–5.1)
Sodium: 140 mmol/L (ref 135–145)
Total Bilirubin: 1.3 mg/dL — ABNORMAL HIGH (ref 0.3–1.2)
Total Protein: 7.9 g/dL (ref 6.5–8.1)

## 2020-07-19 LAB — LIPID PANEL
Cholesterol: 297 mg/dL — ABNORMAL HIGH (ref 0–200)
HDL: 110 mg/dL (ref >40)
LDL Cholesterol: 175 mg/dL — ABNORMAL HIGH (ref 0–99)
Total CHOL/HDL Ratio: 2.7 RATIO
Triglycerides: 58 mg/dL (ref <150)
VLDL: 12 mg/dL (ref 0–40)

## 2020-07-19 LAB — TSH: TSH: 13.299 u[IU]/mL — ABNORMAL HIGH (ref 0.350–4.500)

## 2020-07-20 ENCOUNTER — Encounter: Payer: Self-pay | Admitting: Physician Assistant

## 2020-07-20 ENCOUNTER — Ambulatory Visit: Payer: Medicaid Other | Admitting: Physician Assistant

## 2020-07-20 DIAGNOSIS — E890 Postprocedural hypothyroidism: Secondary | ICD-10-CM

## 2020-07-20 DIAGNOSIS — F17200 Nicotine dependence, unspecified, uncomplicated: Secondary | ICD-10-CM

## 2020-07-20 DIAGNOSIS — Z1239 Encounter for other screening for malignant neoplasm of breast: Secondary | ICD-10-CM

## 2020-07-20 DIAGNOSIS — E785 Hyperlipidemia, unspecified: Secondary | ICD-10-CM

## 2020-07-20 MED ORDER — ALBUTEROL SULFATE HFA 108 (90 BASE) MCG/ACT IN AERS: 2.0000 | INHALATION_SPRAY | Freq: Four times a day (QID) | RESPIRATORY_TRACT | 11 refills | Status: DC | PRN

## 2020-07-20 MED ORDER — LEVOTHYROXINE SODIUM 112 MCG PO TABS: 112.0000 ug | ORAL_TABLET | Freq: Every day | ORAL | 4 refills | Status: DC

## 2020-07-20 NOTE — Progress Notes (Signed)
There were no vitals taken for this visit.   Subjective:    Patient ID: Tara Lewis, female    DOB: 1965/11/04, 55 y.o.   MRN: [id number]  HPI: Tara Lewis is a 55 y.o. female presenting on 07/20/2020 for No chief complaint on file.   HPI  This is a telemedicine appointment through [employer] due to coronavirus pandemic.  I connected with  Tara Lewis on 07/20/20 by a video enabled telemedicine application and verified that I am speaking with the correct person using two identifiers.   I discussed the limitations of evaluation and management by telemedicine. The patient expressed understanding and agreed to proceed.  Pt is at home.  Provider is at office.     Pt is 54yoF with appointment to follow up hypothyroidism and dyslipidemia.  She says she has covid booster appt tomorrow.  She is Still working at [employer].  She is trying to stop smoking because she is seeing a gentleman who is a non-smoker.    She says she Not sleeping well and sometimes her heart races and she feels like when she was diagnosed with thyroid disease.  No cp.  She is Not taking welchol due to cost.  Initially it had been ordered through patient assistance program but that didn't get renewed  She Didn't make it to her mammogram appointment due to she was working  She hopes to get insurance soon    Relevant past medical, surgical, family and social history reviewed and updated as indicated. Interim medical history since our last visit reviewed. Allergies and medications reviewed and updated.   Current Outpatient Medications:  .  albuterol (VENTOLIN HFA) 108 (90 Base) MCG/ACT inhaler, Inhale 2 puffs into the lungs every 6 (six) hours as needed for wheezing or shortness of breath., Disp: 6.7 g, Rfl: 0 .  levothyroxine (EUTHYROX) 100 MCG tablet, Take 1 tablet (100 mcg total) by mouth daily., Disp: 30 tablet, Rfl: 4 .  naproxen sodium (ALEVE) 220 MG tablet, Take 220 mg by mouth., Disp: , Rfl:  .   colesevelam (WELCHOL) 625 MG tablet, Take 3 tablets (1,875 mg total) by mouth 2 (two) times daily with a meal. (Patient not taking: Reported on 07/20/2020), Disp: 540 tablet, Rfl: 3    Review of Systems  Per HPI unless specifically indicated above     Objective:    There were no vitals taken for this visit.  Wt Readings from Last 3 Encounters:  01/28/20 134 lb (60.8 kg)  10/08/19 132 lb 9.6 oz (60.1 kg)  05/08/18 139 lb (63 kg)    Physical Exam Constitutional:      General: She is not in acute distress.    Appearance: She is not ill-appearing.  HENT:     Head: Normocephalic and atraumatic.  Pulmonary:     Effort: No respiratory distress.     Comments: Pt is talking in complete sentences without sob Neurological:     Mental Status: She is alert and oriented to person, place, and time.  Psychiatric:        Attention and Perception: Attention normal.        Mood and Affect: Mood normal.        Speech: Speech normal.        Behavior: Behavior normal. Behavior is cooperative.     Results for orders placed or performed during the hospital encounter of 07/19/20  TSH  Result Value Ref Range   TSH 13.299 (H) 0.350 - 4.500 uIU/mL  Lipid panel  Result Value Ref Range   Cholesterol 297 (H) 0 - 200 mg/dL   Triglycerides 58 <509 mg/dL   HDL 326 >71 mg/dL   Total CHOL/HDL Ratio 2.7 RATIO   VLDL 12 0 - 40 mg/dL   LDL Cholesterol 245 (H) 0 - 99 mg/dL  Comprehensive metabolic panel  Result Value Ref Range   Sodium 140 135 - 145 mmol/L   Potassium 3.3 (L) 3.5 - 5.1 mmol/L   Chloride 100 98 - 111 mmol/L   CO2 27 22 - 32 mmol/L   Glucose, Bld 97 70 - 99 mg/dL   BUN 12 6 - 20 mg/dL   Creatinine, Ser 8.09 0.44 - 1.00 mg/dL   Calcium 9.7 8.9 - 98.3 mg/dL   Total Protein 7.9 6.5 - 8.1 g/dL   Albumin 4.5 3.5 - 5.0 g/dL   AST 43 (H) 15 - 41 U/L   ALT 28 0 - 44 U/L   Alkaline Phosphatase 68 38 - 126 U/L   Total Bilirubin 1.3 (H) 0.3 - 1.2 mg/dL   GFR, Estimated >38 >25 mL/min    Anion gap 13 5 - 15      Assessment & Plan:    Encounter Diagnoses  Name Primary?  . Hyperlipidemia, unspecified hyperlipidemia type Yes  . Postablative hypothyroidism   . Encounter for screening for malignant neoplasm of breast, unspecified screening modality   . Tobacco use disorder      -Reviewed labs with pt -Reorder mammogram -reorder inhaler -counseled and encouraged smoking cessation -Increase levothyroxine -will attempt to find PAP for welchol.   -pt to follow up 3 months.  She is to contact office sooner prn.  She is to notify office if she gets insurance   -after appointment complete, no PAP for welchol was found.  CMA called pt and pt agrees to change to zetia which is available through [facility] at no cost.  Pt will stop by the office this week to pick up application for [employer].

## 2020-07-21 MED ORDER — EZETIMIBE 10 MG PO TABS
10.0000 mg | ORAL_TABLET | Freq: Every day | ORAL | 0 refills | Status: DC
Start: 2020-07-21 — End: 2020-09-02

## 2020-07-21 MED ORDER — ALBUTEROL SULFATE HFA 108 (90 BASE) MCG/ACT IN AERS: 2.0000 | INHALATION_SPRAY | Freq: Four times a day (QID) | RESPIRATORY_TRACT | 0 refills | Status: AC | PRN

## 2020-07-21 MED ORDER — LEVOTHYROXINE SODIUM 112 MCG PO TABS: 112.0000 ug | ORAL_TABLET | Freq: Every day | ORAL | 0 refills | Status: DC

## 2020-07-22 ENCOUNTER — Other Ambulatory Visit: Payer: Self-pay | Admitting: Physician Assistant

## 2020-07-27 ENCOUNTER — Telehealth: Payer: Self-pay

## 2020-07-27 NOTE — Telephone Encounter (Signed)
Called pt to inform of mammogram appointment on 08/06/20 @ 1:15 pm. Advised pt to arrive by 1:00 pm to get registered. Advised pt no deodorant, lotion, powder or perfume to breast or underarm area.

## 2020-08-04 ENCOUNTER — Ambulatory Visit (HOSPITAL_COMMUNITY)
Admission: RE | Admit: 2020-08-04 | Discharge: 2020-08-04 | Disposition: A | Discharge status: Home / Self Care | Payer: Self-pay | Source: Ambulatory Visit | Attending: Physician Assistant | Admitting: Physician Assistant

## 2020-08-04 DIAGNOSIS — Z1239 Encounter for other screening for malignant neoplasm of breast: Secondary | ICD-10-CM | POA: Insufficient documentation

## 2020-08-06 ENCOUNTER — Ambulatory Visit (HOSPITAL_COMMUNITY): Payer: Medicaid Other

## 2020-08-30 ENCOUNTER — Telehealth: Payer: Self-pay | Admitting: Physician Assistant

## 2020-08-30 NOTE — Telephone Encounter (Signed)
Patient called in stating she was having Fever, shortness of breath, headache and bumps on her body. Patient went to urgent care yesterday and they advised her to go to ER. After talking with provider patient was advised that since she is still having same symptoms that she need to go to ER to be evaluated.

## 2020-09-02 ENCOUNTER — Ambulatory Visit (INDEPENDENT_AMBULATORY_CARE_PROVIDER_SITE_OTHER): Payer: 59 | Admitting: "Endocrinology

## 2020-09-02 ENCOUNTER — Telehealth: Payer: Self-pay | Admitting: Physician Assistant

## 2020-09-02 ENCOUNTER — Encounter: Payer: Self-pay | Admitting: "Endocrinology

## 2020-09-02 ENCOUNTER — Other Ambulatory Visit: Payer: Self-pay

## 2020-09-02 VITALS — BP 104/68 | HR 104 | Ht 67.5 in | Wt 127.2 lb

## 2020-09-02 DIAGNOSIS — R002 Palpitations: Secondary | ICD-10-CM | POA: Insufficient documentation

## 2020-09-02 DIAGNOSIS — E890 Postprocedural hypothyroidism: Secondary | ICD-10-CM | POA: Diagnosis not present

## 2020-09-02 MED ORDER — METOPROLOL SUCCINATE ER 25 MG PO TB24: 25.0000 mg | ORAL_TABLET | Freq: Every day | ORAL | 0 refills | Status: DC

## 2020-09-02 MED ORDER — LEVOTHYROXINE SODIUM 88 MCG PO TABS: 88.0000 ug | ORAL_TABLET | Freq: Every day | ORAL | 1 refills | Status: DC

## 2020-09-02 NOTE — Progress Notes (Signed)
Endocrinology Consult Note                                         09/02/2020, 4:13 PM   Tara Lewis is a 55 y.o.-year-old female patient being seen in consultation for hypothyroidism referred by Jacquelin Hawking, PA-C.   Past Medical History:  Diagnosis Date  . Anxiety   . Carpal tunnel syndrome   . Depression   . Fibromyalgia   . GERD (gastroesophageal reflux disease)   . Graves' disease     Past Surgical History:  Procedure Laterality Date  . ABDOMINAL HYSTERECTOMY  2000s  . KNEE SURGERY Right   . OVARIAN CYST SURGERY      Social History   Socioeconomic History  . Marital status: Widowed    Spouse name: Not on file  . Number of children: Not on file  . Years of education: Not on file  . Highest education level: Not on file  Occupational History  . Not on file  Tobacco Use  . Smoking status: Current Every Day Smoker    Packs/day: 0.50    Years: 34.00    Pack years: 17.00    Types: Cigarettes  . Smokeless tobacco: Never Used  Substance and Sexual Activity  . Alcohol use: Yes    Comment: 3-4 cans of beer/daily  . Drug use: No  . Sexual activity: Never    Birth control/protection: Surgical    Comment: last time approx october 2017  Other Topics Concern  . Not on file  Social History Narrative  . Not on file   Social Determinants of Health   Financial Resource Strain: Not on file  Food Insecurity: Not on file  Transportation Needs: Not on file  Physical Activity: Not on file  Stress: Not on file  Social Connections: Not on file    Family History  Problem Relation Age of Onset  . Kidney disease Mother   . Thyroid disease Mother   . Fibromyalgia Mother   . Asthma Mother   . Hypertension Father   . Heart attack Father   . Heart disease Father   . Thyroid disease Sister   . Fibromyalgia Sister     Outpatient Encounter Medications as of 09/02/2020  Medication Sig  .  metoprolol succinate (TOPROL-XL) 25 MG 24 hr tablet Take 1 tablet (25 mg total) by mouth daily.  Marland Kitchen albuterol (VENTOLIN HFA) 108 (90 Base) MCG/ACT inhaler Inhale 2 puffs into the lungs every 6 (six) hours as needed for wheezing or shortness of breath.  . levothyroxine (SYNTHROID) 88 MCG tablet Take 1 tablet (88 mcg total) by mouth daily before breakfast.  . [DISCONTINUED] ezetimibe (ZETIA) 10 MG tablet Take 1 tablet (10 mg total) by mouth daily.  . [DISCONTINUED] levothyroxine (SYNTHROID) 112 MCG tablet Take 1 tablet (112 mcg total) by mouth daily.  . [DISCONTINUED] naproxen sodium (ALEVE) 220 MG tablet Take 220 mg by mouth.   No facility-administered encounter medications  on file as of 09/02/2020.    ALLERGIES: Allergies  Allergen Reactions  . Codeine Other (See Comments)    Patient reports stomach pain.  . Statins Other (See Comments)    Makes legs hurt   VACCINATION STATUS: Immunization History  Administered Date(s) Administered  . Moderna Sars-Covid-2 Vaccination 09/22/2019, 10/21/2019     HPI    Tara Lewis  is a patient with the above medical history. she was diagnosed  with Graves' disease/hyperthyroidism 8 years ago in 2014 which required thyroid ablation with I-131.  She was subsequently started on levothyroxine.  She failed to return for a follow-up.  She is being rereferred for RAI induced hypothyroidism.  She is currently on 112 mcg of levothyroxine daily.  She reports compliance to this medication.  Her interval thyroid thyroid function studies are fluctuating indicating either treatment inconsistency or over replacement.   She has clinical symptoms including tremors, sleep deprivation, palpitations, hot flashes, anxiety.   Pt denies feeling nodules in neck, hoarseness, dysphagia/odynophagia, SOB with lying down.  she has family history of  thyroid disorders in her mother, and siblings.  No family history of thyroid cancer.  She denies dysphagia, shortness of  breath, nor voice change.  She continues to be daily smoker.  ROS:  Constitutional: + Fluctuating body weight,  +fatigue, + subjective hyperthermia Eyes: no blurry vision, no xerophthalmia ENT: no sore throat, no nodules palpated in throat, no dysphagia/odynophagia, no hoarseness Cardiovascular: no Chest Pain, no Shortness of Breath, + palpitations, no leg swelling Respiratory: no cough, no SOB Gastrointestinal: no Nausea/Vomiting/Diarhhea Musculoskeletal: no muscle/joint aches Skin: no rashes Neurological: + tremors, no numbness, no tingling, no dizziness Psychiatric: no depression, + anxiety   Physical Exam: BP 104/68   Pulse (!) 104   Ht 5' 7.5" (1.715 m)   Wt 127 lb 3.2 oz (57.7 kg)   BMI 19.63 kg/m  Wt Readings from Last 3 Encounters:  09/02/20 127 lb 3.2 oz (57.7 kg)  01/28/20 134 lb (60.8 kg)  10/08/19 132 lb 9.6 oz (60.1 kg)    Constitutional:  Body mass index is 19.63 kg/m., not in acute distress, + anxious  state of mind Eyes: PERRLA, EOMI, no exophthalmos ENT: moist mucous membranes, no thyromegaly, no cervical lymphadenopathy Cardiovascular: + Tachycardia  no Murmur/Rubs/Gallops Respiratory:  adequate breathing efforts, no gross chest deformity, Clear to auscultation bilaterally Gastrointestinal: abdomen soft, Non -tender, No distension, Bowel Sounds present Musculoskeletal: no gross deformities, strength intact in all four extremities Skin: moist, warm, no rashes Neurological: + tremor with outstretched hands, Deep tendon reflexes normal in all four extremities.   CMP ( most recent) CMP     Component Value Date/Time   NA 140 07/19/2020 0815   K 3.3 (L) 07/19/2020 0815   CL 100 07/19/2020 0815   CO2 27 07/19/2020 0815   GLUCOSE 97 07/19/2020 0815   BUN 12 07/19/2020 0815   CREATININE 0.68 07/19/2020 0815   CREATININE 0.76 06/12/2016 0727   CALCIUM 9.7 07/19/2020 0815   PROT 7.9 07/19/2020 0815   ALBUMIN 4.5 07/19/2020 0815   AST 43 (H) 07/19/2020  0815   ALT 28 07/19/2020 0815   ALKPHOS 68 07/19/2020 0815   BILITOT 1.3 (H) 07/19/2020 0815   GFRNONAA >60 07/19/2020 0815   GFRAA >60 01/27/2020 0857     Diabetic Labs (most recent): Lab Results  Component Value Date   HGBA1C 4.9 06/12/2016     Lipid Panel ( most recent) Lipid Panel     Component Value  Date/Time   CHOL 297 (H) 07/19/2020 0815   TRIG 58 07/19/2020 0815   HDL 110 07/19/2020 0815   CHOLHDL 2.7 07/19/2020 0815   VLDL 12 07/19/2020 0815   LDLCALC 175 (H) 07/19/2020 0815       Lab Results  Component Value Date   TSH 13.299 (H) 07/19/2020   TSH 2.880 01/27/2020   TSH 0.462 10/03/2019   TSH 0.338 (L) 06/03/2019   TSH 1.243 10/31/2018   TSH 0.782 05/08/2018   TSH 1.870 12/10/2017   TSH 5.896 (H) 05/10/2017   TSH 0.953 10/18/2016   TSH 2.76 06/12/2016       ASSESSMENT: 1. Hypothyroidism 2.  Graves' disease-status post RAI thyroid ablation in May 2014 3.  Iatrogenic thyrotoxicosis  PLAN:    Patient with long-standing hypothyroidism, on levothyroxine therapy. On physical exam , patient  does not  have  gross goiter, thyroid nodules, or neck compression symptoms. 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  She would benefit from a lower dose of levothyroxine.  I discussed and lowered her levothyroxine to 88 mcg p.o. daily before breakfast until next measurement. - We discussed about correct intake of levothyroxine, at fasting, with water, separated by at least 30 minutes from breakfast, and separated by more than 4 hours from calcium, iron, multivitamins, acid reflux medications (PPIs). -Patient is made aware of the fact that thyroid hormone replacement is needed for life, dose to be adjusted by periodic monitoring of thyroid function tests. -We Will check thyroid tests before next visit: TSH, free T4 -Due to absence of clinical goiter, no need for thyroid ultrasound.  In light of her tachycardia, she would benefit from brief  treatment with slow release beta-blocker.  I discussed and added Toprol 25 mg p.o. daily at breakfast.  - Time spent with the patient: 45 minutes, of which >50% was spent in obtaining information about her symptoms, reviewing her previous labs, evaluations, and treatments, counseling her about her history of Graves' disease, RAI induced hypothyroidism, tachycardia, and developing a plan to confirm the diagnosis and long term treatment as necessary. Please refer to " Patient Self Inventory" in the Media  tab for reviewed elements of pertinent patient history.  Norvel Richards participated in the discussions, expressed understanding, and voiced agreement with the above plans.  All questions were answered to her satisfaction. she is encouraged to contact clinic should she have any questions or concerns prior to her return visit.  Return in about 3 months (around 12/03/2020) for F/U with Pre-visit Labs.  Marquis Lunch, MD [facility] [address] [place], [place] [address] Phone: [phone]  Fax: [phone]   09/02/2020, 4:13 PM  This note was partially dictated with voice recognition software. Similar sounding words can be transcribed inadequately or may not  be corrected upon review.

## 2020-09-02 NOTE — Progress Notes (Signed)
Pt experiencing weight loss over the past 3 months, "brain fog" x 2-3wks, noticed rash on lower extremeties, trunk, no appetite x 3-4 days.

## 2020-09-02 NOTE — Telephone Encounter (Signed)
Pt called wanting referral to endocrinology for what she thinks is Graves disease.  She called this office Monday 5/2 with reports of fever and sob and was encouraged to go to ER for evaluation.  She says she went to [facility] but was never seen.  She says she had negative covid test at urgent care on 5/1 but records for that are not available through epic.  Pt says she is having hives on lower extremities that she attributes to her thyroid.  She says she was seen by Dr Fransico Him in the past and would like to see him again.  Discussed with pt that referral will be entered  But that appointment may be some time off so she may still need to go to ER for evaluation if she is having urgent symptoms.  She says her fever and sob have resolved.  She will be mailed an application for [employer] financial assistance.

## 2020-09-06 ENCOUNTER — Telehealth: Payer: Self-pay

## 2020-09-06 NOTE — Telephone Encounter (Signed)
Received paperwork from [facility] [employer] wanting Dr Fransico Him to fill out FMLA paperwork for pt. Dr Fransico Him advised patient at her last appt that he does not fill out for [facility]. I have faxed this information back to [facility]

## 2020-09-07 ENCOUNTER — Ambulatory Visit: Payer: Medicaid Other | Admitting: Physician Assistant

## 2020-10-11 ENCOUNTER — Other Ambulatory Visit: Payer: Self-pay | Admitting: Physician Assistant

## 2020-10-25 ENCOUNTER — Ambulatory Visit: Payer: Medicaid Other | Admitting: Physician Assistant

## 2020-12-10 LAB — TSH: TSH: 1.12 u[IU]/mL (ref 0.450–4.500)

## 2020-12-10 LAB — T4, FREE: Free T4: 1.2 ng/dL (ref 0.82–1.77)

## 2020-12-14 ENCOUNTER — Other Ambulatory Visit: Payer: Self-pay

## 2020-12-14 ENCOUNTER — Encounter: Payer: Self-pay | Admitting: "Endocrinology

## 2020-12-14 ENCOUNTER — Ambulatory Visit (INDEPENDENT_AMBULATORY_CARE_PROVIDER_SITE_OTHER): Payer: 59 | Admitting: "Endocrinology

## 2020-12-14 VITALS — BP 124/80 | HR 80 | Ht 67.5 in | Wt 128.2 lb

## 2020-12-14 DIAGNOSIS — E890 Postprocedural hypothyroidism: Secondary | ICD-10-CM

## 2020-12-14 MED ORDER — LEVOTHYROXINE SODIUM 88 MCG PO TABS: 88.0000 ug | ORAL_TABLET | Freq: Every day | ORAL | 1 refills | Status: DC

## 2020-12-14 NOTE — Progress Notes (Signed)
12/14/2020, 1:09 PM   Endocrinology follow-up note  Tara Lewis is a 55 y.o.-year-old female patient being seen in follow-up after she was seen in consultation for hypothyroidism . She was found to have iatrogenic hyperthyroidism during her last visit.     Past Medical History:  Diagnosis Date   Anxiety    Carpal tunnel syndrome    Depression    Fibromyalgia    GERD (gastroesophageal reflux disease)    Graves' disease     Past Surgical History:  Procedure Laterality Date   ABDOMINAL HYSTERECTOMY  2000s   KNEE SURGERY Right    OVARIAN CYST SURGERY      Social History   Socioeconomic History   Marital status: Widowed    Spouse name: Not on file   Number of children: Not on file   Years of education: Not on file   Highest education level: Not on file  Occupational History   Not on file  Tobacco Use   Smoking status: Every Day    Packs/day: 0.50    Years: 34.00    Pack years: 17.00    Types: Cigarettes   Smokeless tobacco: Never  Substance and Sexual Activity   Alcohol use: Yes    Comment: 3-4 cans of beer/daily   Drug use: No   Sexual activity: Never    Birth control/protection: Surgical    Comment: last time approx october 2017  Other Topics Concern   Not on file  Social History Narrative   Not on file   Social Determinants of Health   Financial Resource Strain: Not on file  Food Insecurity: Not on file  Transportation Needs: Not on file  Physical Activity: Not on file  Stress: Not on file  Social Connections: Not on file    Family History  Problem Relation Age of Onset   Kidney disease Mother    Thyroid disease Mother    Fibromyalgia Mother    Asthma Mother    Hypertension Father    Heart attack Father    Heart disease Father    Thyroid disease Sister    Fibromyalgia Sister     Outpatient Encounter Medications as of  12/14/2020  Medication Sig   famotidine (PEPCID) 20 MG tablet Take 20 mg by mouth 2 (two) times daily.   albuterol (VENTOLIN HFA) 108 (90 Base) MCG/ACT inhaler Inhale 2 puffs into the lungs every 6 (six) hours as needed for wheezing or shortness of breath.   levothyroxine (SYNTHROID) 88 MCG tablet Take 1 tablet (88 mcg total) by mouth daily before breakfast.   [DISCONTINUED] levothyroxine (SYNTHROID) 88 MCG tablet Take 1 tablet (88 mcg total) by mouth daily before breakfast.   [DISCONTINUED] metoprolol succinate (TOPROL-XL) 25 MG 24 hr tablet Take 1 tablet (25 mg total) by mouth daily.   No facility-administered encounter medications on file as of  12/14/2020.    ALLERGIES: Allergies  Allergen Reactions   Codeine Other (See Comments)    Patient reports stomach pain.   Statins Other (See Comments)    Makes legs hurt   VACCINATION STATUS: Immunization History  Administered Date(s) Administered   Moderna Sars-Covid-2 Vaccination 09/22/2019, 10/21/2019     HPI    Tara Lewis  is a patient with the above medical history. she was diagnosed  with Graves' disease/hyperthyroidism 8 years ago in 2014 which required thyroid ablation with I-131.  She was subsequently started on levothyroxine.  Due to iatrogenic thyrotoxicosis, she was started on lower dose of levothyroxine during her last visit at 88 mcg.  She reports consistency and compliance with medication.  She returns with thyroid function test within target.  She has no new complaints.   Her previous clinical symptoms including tremors, sleep deprivation, palpitations have largely resolved.  Pt denies feeling nodules in neck, hoarseness, dysphagia/odynophagia, SOB with lying down.  she has family history of  thyroid disorders in her mother, and siblings.  No family history of thyroid cancer.  She denies dysphagia, shortness of breath, nor voice change.  She continues to be daily smoker.  ROS: Limited as above.   Physical  Exam: BP 124/80   Pulse 80   Ht 5' 7.5" (1.715 m)   Wt 128 lb 3.2 oz (58.2 kg)   BMI 19.78 kg/m  Wt Readings from Last 3 Encounters:  12/14/20 128 lb 3.2 oz (58.2 kg)  09/02/20 127 lb 3.2 oz (57.7 kg)  01/28/20 134 lb (60.8 kg)       CMP ( most recent) CMP     Component Value Date/Time   NA 140 07/19/2020 0815   K 3.3 (L) 07/19/2020 0815   CL 100 07/19/2020 0815   CO2 27 07/19/2020 0815   GLUCOSE 97 07/19/2020 0815   BUN 12 07/19/2020 0815   CREATININE 0.68 07/19/2020 0815   CREATININE 0.76 06/12/2016 0727   CALCIUM 9.7 07/19/2020 0815   PROT 7.9 07/19/2020 0815   ALBUMIN 4.5 07/19/2020 0815   AST 43 (H) 07/19/2020 0815   ALT 28 07/19/2020 0815   ALKPHOS 68 07/19/2020 0815   BILITOT 1.3 (H) 07/19/2020 0815   GFRNONAA >60 07/19/2020 0815   GFRAA >60 01/27/2020 0857     Diabetic Labs (most recent): Lab Results  Component Value Date   HGBA1C 4.9 06/12/2016     Lipid Panel ( most recent) Lipid Panel     Component Value Date/Time   CHOL 297 (H) 07/19/2020 0815   TRIG 58 07/19/2020 0815   HDL 110 07/19/2020 0815   CHOLHDL 2.7 07/19/2020 0815   VLDL 12 07/19/2020 0815   LDLCALC 175 (H) 07/19/2020 0815       Lab Results  Component Value Date   TSH 1.120 12/09/2020   TSH 13.299 (H) 07/19/2020   TSH 2.880 01/27/2020   TSH 0.462 10/03/2019   TSH 0.338 (L) 06/03/2019   TSH 1.243 10/31/2018   TSH 0.782 05/08/2018   TSH 1.870 12/10/2017   TSH 5.896 (H) 05/10/2017   TSH 0.953 10/18/2016   FREET4 1.20 12/09/2020       ASSESSMENT: 1. Hypothyroidism 2.  Graves' disease-status post RAI thyroid ablation in May 2014   PLAN:  Her previsit thyroid function tests are consistent with appropriate replacement.  She is advised to continue levothyroxine 88 mcg p.o. daily before breakfast.   - We discussed about the correct intake of her thyroid hormone, on empty stomach  at fasting, with water, separated by at least 30 minutes from breakfast and other  medications,  and separated by more than 4 hours from calcium, iron, multivitamins, acid reflux medications (PPIs). -Patient is made aware of the fact that thyroid hormone replacement is needed for life, dose to be adjusted by periodic monitoring of thyroid function tests.  -Due to absence of clinical goiter, no need for thyroid ultrasound. -Her pulse rate is 80 this morning.  She will not need the beta-blocker any longer, will discontinue.    I spent 22 minutes in the care of the patient today including review of labs from Thyroid Function, CMP, and other relevant labs ; imaging/biopsy records (current and previous including abstractions from other facilities); face-to-face time discussing  her lab results and symptoms, medications doses, her options of short and long term treatment based on the latest standards of care / guidelines;   and documenting the encounter.  Norvel Richards  participated in the discussions, expressed understanding, and voiced agreement with the above plans.  All questions were answered to her satisfaction. she is encouraged to contact clinic should she have any questions or concerns prior to her return visit.   Return in about 6 months (around 06/16/2021) for F/U with Pre-visit Labs.  Marquis Lunch, MD [facility] [address] [place], [place] [address] Phone: [phone]  Fax: [phone]   12/14/2020, 1:09 PM  This note was partially dictated with voice recognition software. Similar sounding words can be transcribed inadequately or may not  be corrected upon review.

## 2021-01-20 ENCOUNTER — Telehealth: Payer: Self-pay | Admitting: "Endocrinology

## 2021-01-20 NOTE — Telephone Encounter (Signed)
Received medical records request from [facility]. Sent request to [facility] and scan

## 2021-01-26 ENCOUNTER — Other Ambulatory Visit: Payer: Self-pay

## 2021-01-26 ENCOUNTER — Other Ambulatory Visit (HOSPITAL_COMMUNITY)
Admission: RE | Admit: 2021-01-26 | Discharge: 2021-01-26 | Disposition: A | Discharge status: Home / Self Care | Payer: 59 | Source: Ambulatory Visit | Attending: Internal Medicine | Admitting: Internal Medicine

## 2021-01-26 DIAGNOSIS — R197 Diarrhea, unspecified: Secondary | ICD-10-CM | POA: Insufficient documentation

## 2021-01-26 LAB — C DIFFICILE QUICK SCREEN W PCR REFLEX
C Diff antigen: NEGATIVE
C Diff interpretation: NOT DETECTED
C Diff toxin: NEGATIVE

## 2021-01-27 LAB — GIARDIA/CRYPTOSPORIDIUM EIA
Cryptosporidium EIA: NEGATIVE
Giardia Ag, Stl: NEGATIVE

## 2021-01-27 LAB — LACTOFERRIN, FECAL, QUALITATIVE: Lactoferrin, Fecal, Qual: NEGATIVE

## 2021-01-31 LAB — STOOL CULTURE: E coli, Shiga toxin Assay: NEGATIVE

## 2021-01-31 LAB — STOOL CULTURE REFLEX - CMPCXR

## 2021-01-31 LAB — STOOL CULTURE REFLEX - RSASHR

## 2021-02-03 LAB — OVA + PARASITE EXAM

## 2021-02-03 LAB — O&P RESULT

## 2021-05-23 ENCOUNTER — Encounter: Payer: Self-pay | Admitting: "Endocrinology

## 2021-06-14 ENCOUNTER — Other Ambulatory Visit (HOSPITAL_COMMUNITY)
Admission: RE | Admit: 2021-06-14 | Discharge: 2021-06-14 | Disposition: A | Discharge status: Home / Self Care | Payer: 59 | Source: Ambulatory Visit | Attending: "Endocrinology | Admitting: "Endocrinology

## 2021-06-14 DIAGNOSIS — E890 Postprocedural hypothyroidism: Secondary | ICD-10-CM | POA: Insufficient documentation

## 2021-06-14 LAB — T4, FREE: Free T4: 0.68 ng/dL (ref 0.61–1.12)

## 2021-06-14 LAB — TSH: TSH: 3.085 u[IU]/mL (ref 0.350–4.500)

## 2021-06-16 ENCOUNTER — Other Ambulatory Visit: Payer: Self-pay

## 2021-06-16 ENCOUNTER — Encounter: Payer: Self-pay | Admitting: "Endocrinology

## 2021-06-16 ENCOUNTER — Ambulatory Visit: Payer: 59 | Admitting: "Endocrinology

## 2021-06-16 VITALS — Ht 67.5 in | Wt 132.4 lb

## 2021-06-16 DIAGNOSIS — E890 Postprocedural hypothyroidism: Secondary | ICD-10-CM

## 2021-06-16 MED ORDER — LEVOTHYROXINE SODIUM 88 MCG PO TABS: 88.0000 ug | ORAL_TABLET | Freq: Every day | ORAL | 1 refills | Status: DC

## 2021-06-16 NOTE — Progress Notes (Signed)
06/16/2021, 1:57 PM   Endocrinology follow-up note  Tara Lewis is a 56 y.o.-year-old female patient being seen in follow-up after she was seen in consultation for hypothyroidism . She was found to have iatrogenic hyperthyroidism during her last visit.     Past Medical History:  Diagnosis Date   Anxiety    Carpal tunnel syndrome    Depression    Fibromyalgia    GERD (gastroesophageal reflux disease)    Graves' disease     Past Surgical History:  Procedure Laterality Date   ABDOMINAL HYSTERECTOMY  2000s   KNEE SURGERY Right    OVARIAN CYST SURGERY      Social History   Socioeconomic History   Marital status: Widowed    Spouse name: Not on file   Number of children: Not on file   Years of education: Not on file   Highest education level: Not on file  Occupational History   Not on file  Tobacco Use   Smoking status: Every Day    Packs/day: 0.50    Years: 34.00    Pack years: 17.00    Types: Cigarettes   Smokeless tobacco: Never  Substance and Sexual Activity   Alcohol use: Yes    Comment: 3-4 cans of beer/daily   Drug use: No   Sexual activity: Never    Birth control/protection: Surgical    Comment: last time approx october 2017  Other Topics Concern   Not on file  Social History Narrative   Not on file   Social Determinants of Health   Financial Resource Strain: Not on file  Food Insecurity: Not on file  Transportation Needs: Not on file  Physical Activity: Not on file  Stress: Not on file  Social Connections: Not on file    Family History  Problem Relation Age of Onset   Kidney disease Mother    Thyroid disease Mother    Fibromyalgia Mother    Asthma Mother    Hypertension Father    Heart attack Father    Heart disease Father    Thyroid disease Sister    Fibromyalgia Sister     Outpatient Encounter Medications as of  06/16/2021  Medication Sig   albuterol (VENTOLIN HFA) 108 (90 Base) MCG/ACT inhaler Inhale 2 puffs into the lungs every 6 (six) hours as needed for wheezing or shortness of breath.   levothyroxine (SYNTHROID) 88 MCG tablet Take 1 tablet (88 mcg total) by mouth daily before breakfast.   [DISCONTINUED] famotidine (PEPCID) 20 MG tablet Take 20 mg by mouth 2 (two) times daily.   [DISCONTINUED] levothyroxine (SYNTHROID) 88 MCG tablet Take 1 tablet (88 mcg total) by mouth daily before breakfast.   No facility-administered encounter medications on file as of 06/16/2021.    ALLERGIES: Allergies  Allergen Reactions   Codeine Other (See Comments)    Patient  reports stomach pain.   Statins Other (See Comments)    Makes legs hurt   VACCINATION STATUS: Immunization History  Administered Date(s) Administered   Moderna Sars-Covid-2 Vaccination 09/22/2019, 10/21/2019     HPI    Tara Lewis  is a patient with the above medical history. she was diagnosed  with Graves' disease/hyperthyroidism 8 years ago in 2014 which required thyroid ablation with I-131.  She was subsequently started on levothyroxine.  Due to iatrogenic thyrotoxicosis, she was started on lower dose of levothyroxine during her last visit at 88 mcg.  She reports consistency and compliance with medication.  She returns with thyroid function test within target.  She feels much better than before.  She has no new complaints today.    Her previous clinical symptoms including tremors, sleep deprivation, palpitations have largely resolved.  Pt denies feeling nodules in neck, hoarseness, dysphagia/odynophagia, SOB with lying down.  she has family history of  thyroid disorders in her mother, and siblings.  No family history of thyroid cancer.  She denies dysphagia, shortness of breath, nor voice change.  She continues to be daily smoker.  ROS: Limited as above.   Physical Exam: Ht 5' 7.5" (1.715 m)    Wt 132 lb 6.4 oz (60.1 kg)     BMI 20.43 kg/m  Wt Readings from Last 3 Encounters:  06/16/21 132 lb 6.4 oz (60.1 kg)  12/14/20 128 lb 3.2 oz (58.2 kg)  09/02/20 127 lb 3.2 oz (57.7 kg)       CMP ( most recent) CMP     Component Value Date/Time   NA 140 07/19/2020 0815   K 3.3 (L) 07/19/2020 0815   CL 100 07/19/2020 0815   CO2 27 07/19/2020 0815   GLUCOSE 97 07/19/2020 0815   BUN 12 07/19/2020 0815   CREATININE 0.68 07/19/2020 0815   CREATININE 0.76 06/12/2016 0727   CALCIUM 9.7 07/19/2020 0815   PROT 7.9 07/19/2020 0815   ALBUMIN 4.5 07/19/2020 0815   AST 43 (H) 07/19/2020 0815   ALT 28 07/19/2020 0815   ALKPHOS 68 07/19/2020 0815   BILITOT 1.3 (H) 07/19/2020 0815   GFRNONAA >60 07/19/2020 0815   GFRAA >60 01/27/2020 0857     Diabetic Labs (most recent): Lab Results  Component Value Date   HGBA1C 4.9 06/12/2016     Lipid Panel ( most recent) Lipid Panel     Component Value Date/Time   CHOL 297 (H) 07/19/2020 0815   TRIG 58 07/19/2020 0815   HDL 110 07/19/2020 0815   CHOLHDL 2.7 07/19/2020 0815   VLDL 12 07/19/2020 0815   LDLCALC 175 (H) 07/19/2020 0815       Lab Results  Component Value Date   TSH 3.085 06/14/2021   TSH 1.120 12/09/2020   TSH 13.299 (H) 07/19/2020   TSH 2.880 01/27/2020   TSH 0.462 10/03/2019   TSH 0.338 (L) 06/03/2019   TSH 1.243 10/31/2018   TSH 0.782 05/08/2018   TSH 1.870 12/10/2017   TSH 5.896 (H) 05/10/2017   FREET4 0.68 06/14/2021   FREET4 1.20 12/09/2020       ASSESSMENT: 1. Hypothyroidism 2.  Graves' disease-status post RAI thyroid ablation in May 2014   PLAN:  Her previsit thyroid function tests are consistent with appropriate replacement.  She is advised to continue levothyroxine 88 mcg p.o. daily before breakfast.     - We discussed about the correct intake of her thyroid hormone, on empty stomach at fasting, with water, separated by at  least 30 minutes from breakfast and other medications,  and separated by more than 4 hours from  calcium, iron, multivitamins, acid reflux medications (PPIs). -Patient is made aware of the fact that thyroid hormone replacement is needed for life, dose to be adjusted by periodic monitoring of thyroid function tests.   -Due to absence of clinical goiter, no need for thyroid ultrasound. -Her pulse rate is 80 this morning.  She will not need the beta-blocker any longer, will discontinue.    I spent 21 minutes in the care of the patient today including review of labs from Thyroid Function, CMP, and other relevant labs ; imaging/biopsy records (current and previous including abstractions from other facilities); face-to-face time discussing  her lab results and symptoms, medications doses, her options of short and long term treatment based on the latest standards of care / guidelines;   and documenting the encounter.  Norvel Richards  participated in the discussions, expressed understanding, and voiced agreement with the above plans.  All questions were answered to her satisfaction. she is encouraged to contact clinic should she have any questions or concerns prior to her return visit.   Return in about 6 months (around 12/14/2021) for F/U with Pre-visit Labs.  Marquis Lunch, MD [facility] [address] [place], [place] [address] Phone: [phone]  Fax: [phone]   06/16/2021, 1:57 PM  This note was partially dictated with voice recognition software. Similar sounding words can be transcribed inadequately or may not  be corrected upon review.

## 2021-12-08 ENCOUNTER — Other Ambulatory Visit (HOSPITAL_COMMUNITY)
Admission: RE | Admit: 2021-12-08 | Discharge: 2021-12-08 | Disposition: A | Discharge status: Home / Self Care | Payer: BC Managed Care – PPO | Source: Ambulatory Visit | Attending: "Endocrinology | Admitting: "Endocrinology

## 2021-12-08 DIAGNOSIS — E890 Postprocedural hypothyroidism: Secondary | ICD-10-CM | POA: Insufficient documentation

## 2021-12-08 LAB — TSH: TSH: 0.43 u[IU]/mL (ref 0.350–4.500)

## 2021-12-08 LAB — T4, FREE: Free T4: 0.81 ng/dL (ref 0.61–1.12)

## 2021-12-13 ENCOUNTER — Ambulatory Visit (INDEPENDENT_AMBULATORY_CARE_PROVIDER_SITE_OTHER): Payer: BC Managed Care – PPO | Admitting: "Endocrinology

## 2021-12-13 ENCOUNTER — Encounter: Payer: Self-pay | Admitting: "Endocrinology

## 2021-12-13 VITALS — BP 120/78 | HR 84 | Ht 67.5 in | Wt 136.8 lb

## 2021-12-13 DIAGNOSIS — E782 Mixed hyperlipidemia: Secondary | ICD-10-CM | POA: Diagnosis not present

## 2021-12-13 DIAGNOSIS — F17200 Nicotine dependence, unspecified, uncomplicated: Secondary | ICD-10-CM | POA: Insufficient documentation

## 2021-12-13 DIAGNOSIS — E890 Postprocedural hypothyroidism: Secondary | ICD-10-CM

## 2021-12-13 MED ORDER — LEVOTHYROXINE SODIUM 88 MCG PO TABS
88.0000 ug | ORAL_TABLET | Freq: Every day | ORAL | 1 refills | Status: DC
Start: 2021-12-13 — End: 2022-03-03

## 2021-12-13 NOTE — Progress Notes (Signed)
12/13/2021, 10:27 AM   Endocrinology follow-up note  Tara Lewis is a 56 y.o.-year-old female patient being seen in follow-up after she was seen in consultation for hypothyroidism .Marland Kitchen     Past Medical History:  Diagnosis Date   Anxiety    Carpal tunnel syndrome    Depression    Fibromyalgia    GERD (gastroesophageal reflux disease)    Graves' disease     Past Surgical History:  Procedure Laterality Date   ABDOMINAL HYSTERECTOMY  2000s   KNEE SURGERY Right    OVARIAN CYST SURGERY      Social History   Socioeconomic History   Marital status: Widowed    Spouse name: Not on file   Number of children: Not on file   Years of education: Not on file   Highest education level: Not on file  Occupational History   Not on file  Tobacco Use   Smoking status: Every Day    Packs/day: 0.50    Years: 34.00    Total pack years: 17.00    Types: Cigarettes   Smokeless tobacco: Never  Substance and Sexual Activity   Alcohol use: Yes    Comment: 3-4 cans of beer/daily   Drug use: No   Sexual activity: Never    Birth control/protection: Surgical    Comment: last time approx october 2017  Other Topics Concern   Not on file  Social History Narrative   Not on file   Social Determinants of Health   Financial Resource Strain: Not on file  Food Insecurity: Not on file  Transportation Needs: Not on file  Physical Activity: Not on file  Stress: Not on file  Social Connections: Not on file    Family History  Problem Relation Age of Onset   Kidney disease Mother    Thyroid disease Mother    Fibromyalgia Mother    Asthma Mother    Hypertension Father    Heart attack Father    Heart disease Father    Thyroid disease Sister    Fibromyalgia Sister     Outpatient Encounter Medications as of 12/13/2021  Medication Sig   esomeprazole (NEXIUM) 20 MG capsule Take  20 mg by mouth daily at 12 noon.   albuterol (VENTOLIN HFA) 108 (90 Base) MCG/ACT inhaler Inhale 2 puffs into the lungs every 6 (six) hours as needed for wheezing or shortness of breath.   dicyclomine (BENTYL) 10 MG capsule Take 10 mg by mouth daily.   levothyroxine (SYNTHROID) 88 MCG tablet Take 1 tablet (88 mcg total) by mouth daily before breakfast.   SYMBICORT 80-4.5 MCG/ACT inhaler SMARTSIG:2 Puff(s) By Mouth Twice Daily   [DISCONTINUED] levothyroxine (SYNTHROID) 88 MCG tablet Take 1 tablet (88 mcg total) by mouth daily before breakfast.   No facility-administered encounter medications on file as of 12/13/2021.    ALLERGIES:  Allergies  Allergen Reactions   Codeine Other (See Comments)    Patient reports stomach pain.   Statins Other (See Comments)    Makes legs hurt   VACCINATION STATUS: Immunization History  Administered Date(s) Administered   Moderna Sars-Covid-2 Vaccination 09/22/2019, 10/21/2019     HPI    Tara Lewis  is a patient with the above medical history. she was diagnosed  with Graves' disease/hyperthyroidism 8 years ago in 2014 which required thyroid ablation with I-131.  She was subsequently started on levothyroxine.  She is currently on levothyroxine 88 mcg p.o. daily before breakfast.  Her previsit labs are consistent with appropriate replacement.  Patient is still complains about fatigue. She continues to smoke.  Her previous chest x-ray indicated signs of COPD.  Patient is recently initiated on bronchodilators.  She denies tremors, heat intolerance, or palpitations.  Pt denies feeling nodules in neck, hoarseness, dysphagia/odynophagia, SOB with lying down.  she has family history of  thyroid disorders in her mother, and siblings.  No family history of thyroid cancer.  She denies dysphagia, shortness of breath, nor voice change.  She continues to be daily smoker.  ROS: Limited as above.   Physical Exam: BP 120/78   Pulse 84   Ht 5' 7.5" (1.715  m)   Wt 136 lb 12.8 oz (62.1 kg)   BMI 21.11 kg/m  Wt Readings from Last 3 Encounters:  12/13/21 136 lb 12.8 oz (62.1 kg)  06/16/21 132 lb 6.4 oz (60.1 kg)  12/14/20 128 lb 3.2 oz (58.2 kg)       CMP ( most recent) CMP     Component Value Date/Time   NA 140 07/19/2020 0815   K 3.3 (L) 07/19/2020 0815   CL 100 07/19/2020 0815   CO2 27 07/19/2020 0815   GLUCOSE 97 07/19/2020 0815   BUN 12 07/19/2020 0815   CREATININE 0.68 07/19/2020 0815   CREATININE 0.76 06/12/2016 0727   CALCIUM 9.7 07/19/2020 0815   PROT 7.9 07/19/2020 0815   ALBUMIN 4.5 07/19/2020 0815   AST 43 (H) 07/19/2020 0815   ALT 28 07/19/2020 0815   ALKPHOS 68 07/19/2020 0815   BILITOT 1.3 (H) 07/19/2020 0815   GFRNONAA >60 07/19/2020 0815   GFRAA >60 01/27/2020 0857     Diabetic Labs (most recent): Lab Results  Component Value Date   HGBA1C 4.9 06/12/2016     Lipid Panel ( most recent) Lipid Panel     Component Value Date/Time   CHOL 297 (H) 07/19/2020 0815   TRIG 58 07/19/2020 0815   HDL 110 07/19/2020 0815   CHOLHDL 2.7 07/19/2020 0815   VLDL 12 07/19/2020 0815   LDLCALC 175 (H) 07/19/2020 0815       Lab Results  Component Value Date   TSH 0.430 12/08/2021   TSH 3.085 06/14/2021   TSH 1.120 12/09/2020   TSH 13.299 (H) 07/19/2020   TSH 2.880 01/27/2020   TSH 0.462 10/03/2019   TSH 0.338 (L) 06/03/2019   TSH 1.243 10/31/2018   TSH 0.782 05/08/2018   TSH 1.870 12/10/2017   FREET4 0.81 12/08/2021   FREET4 0.68 06/14/2021   FREET4 1.20 12/09/2020       ASSESSMENT: 1. Hypothyroidism 2.  Graves' disease-status post RAI thyroid ablation in May 2014   PLAN:  Her previsit thyroid function tests are consistent with appropriate replacement.  She is advised to continue levothyroxine 88 mcg p.o. daily before breakfast.     - We discussed about the correct intake  of her thyroid hormone, on empty stomach at fasting, with water, separated by at least 30 minutes from breakfast and other  medications,  and separated by more than 4 hours from calcium, iron, multivitamins, acid reflux medications (PPIs). -Patient is made aware of the fact that thyroid hormone replacement is needed for life, dose to be adjusted by periodic monitoring of thyroid function tests.    -Due to absence of clinical goiter, no need for thyroid ultrasound. -Her pulse rate is 84 this morning.    Complains of fatigue are likely due to her COPD.  She may need a repeat chest x-ray.  She is counseled on smoking cessation.   The patient was counseled on the dangers of tobacco use, and was advised to quit.  Reviewed strategies to maximize success, including removing cigarettes and smoking materials from environment.   Severe hyperlipidemia.  She is advised on whole food plant-based diet.  If her LDL remains above 100, she will should be considered for statin intervention during her next visit. She is advised to maintain close follow-up with her PCP.   I spent 32 minutes in the care of the patient today including review of labs from Thyroid Function, CMP, and other relevant labs ; imaging/biopsy records (current and previous including abstractions from other facilities); face-to-face time discussing  her lab results and symptoms, medications doses, her options of short and long term treatment based on the latest standards of care / guidelines;   and documenting the encounter.  Norvel Richards  participated in the discussions, expressed understanding, and voiced agreement with the above plans.  All questions were answered to her satisfaction. she is encouraged to contact clinic should she have any questions or concerns prior to her return visit.    Return in about 6 months (around 06/15/2022) for F/U with Pre-visit Labs.  Marquis Lunch, MD [facility] [address] [place], [place] [address] Phone: [phone]  Fax: [phone]   12/13/2021, 10:27  AM  This note was partially dictated with voice recognition software. Similar sounding words can be transcribed inadequately or may not  be corrected upon review.

## 2021-12-14 ENCOUNTER — Ambulatory Visit: Payer: 59 | Admitting: "Endocrinology

## 2021-12-15 DIAGNOSIS — Z299 Encounter for prophylactic measures, unspecified: Secondary | ICD-10-CM | POA: Diagnosis not present

## 2021-12-15 DIAGNOSIS — J449 Chronic obstructive pulmonary disease, unspecified: Secondary | ICD-10-CM | POA: Diagnosis not present

## 2021-12-15 DIAGNOSIS — R599 Enlarged lymph nodes, unspecified: Secondary | ICD-10-CM | POA: Diagnosis not present

## 2021-12-15 DIAGNOSIS — F17210 Nicotine dependence, cigarettes, uncomplicated: Secondary | ICD-10-CM | POA: Diagnosis not present

## 2022-02-10 DIAGNOSIS — M79674 Pain in right toe(s): Secondary | ICD-10-CM | POA: Diagnosis not present

## 2022-02-10 DIAGNOSIS — J449 Chronic obstructive pulmonary disease, unspecified: Secondary | ICD-10-CM | POA: Diagnosis not present

## 2022-02-10 DIAGNOSIS — Z885 Allergy status to narcotic agent status: Secondary | ICD-10-CM | POA: Diagnosis not present

## 2022-02-10 DIAGNOSIS — Z888 Allergy status to other drugs, medicaments and biological substances status: Secondary | ICD-10-CM | POA: Diagnosis not present

## 2022-02-10 DIAGNOSIS — M7989 Other specified soft tissue disorders: Secondary | ICD-10-CM | POA: Diagnosis not present

## 2022-02-10 DIAGNOSIS — F17210 Nicotine dependence, cigarettes, uncomplicated: Secondary | ICD-10-CM | POA: Diagnosis not present

## 2022-02-10 DIAGNOSIS — F1290 Cannabis use, unspecified, uncomplicated: Secondary | ICD-10-CM | POA: Diagnosis not present

## 2022-02-10 DIAGNOSIS — M19071 Primary osteoarthritis, right ankle and foot: Secondary | ICD-10-CM | POA: Diagnosis not present

## 2022-02-10 DIAGNOSIS — E079 Disorder of thyroid, unspecified: Secondary | ICD-10-CM | POA: Diagnosis not present

## 2022-02-10 DIAGNOSIS — M10071 Idiopathic gout, right ankle and foot: Secondary | ICD-10-CM | POA: Diagnosis not present

## 2022-03-03 ENCOUNTER — Other Ambulatory Visit: Payer: Self-pay | Admitting: "Endocrinology

## 2022-05-26 ENCOUNTER — Other Ambulatory Visit: Payer: Self-pay | Admitting: "Endocrinology

## 2022-06-15 ENCOUNTER — Ambulatory Visit: Payer: BC Managed Care – PPO | Admitting: "Endocrinology

## 2022-06-26 ENCOUNTER — Ambulatory Visit: Payer: BC Managed Care – PPO | Admitting: "Endocrinology

## 2022-08-24 ENCOUNTER — Other Ambulatory Visit: Payer: Self-pay | Admitting: Nurse Practitioner

## 2022-08-25 ENCOUNTER — Other Ambulatory Visit: Payer: Self-pay | Admitting: Nurse Practitioner

## 2023-01-05 ENCOUNTER — Encounter: Payer: Self-pay | Admitting: Neurology

## 2023-02-06 ENCOUNTER — Other Ambulatory Visit (INDEPENDENT_AMBULATORY_CARE_PROVIDER_SITE_OTHER): Payer: Medicaid Other

## 2023-02-06 ENCOUNTER — Encounter: Payer: Self-pay | Admitting: Neurology

## 2023-02-06 ENCOUNTER — Ambulatory Visit: Payer: Medicaid Other | Admitting: Neurology

## 2023-02-06 VITALS — BP 98/64 | HR 97 | Ht 67.5 in | Wt 132.0 lb

## 2023-02-06 DIAGNOSIS — G621 Alcoholic polyneuropathy: Secondary | ICD-10-CM

## 2023-02-06 DIAGNOSIS — M545 Low back pain, unspecified: Secondary | ICD-10-CM

## 2023-02-06 MED ORDER — DULOXETINE HCL 30 MG PO CPEP: 30.0000 mg | ORAL_CAPSULE | Freq: Every day | ORAL | 3 refills | Status: DC

## 2023-02-06 NOTE — Patient Instructions (Addendum)
Nerve testing of the legs  Check labs  Stop Lyrica  Start duloxetine (Cymbalta) 30mg  daily   ELECTROMYOGRAM AND NERVE CONDUCTION STUDIES (EMG/NCS) INSTRUCTIONS  How to Prepare The neurologist conducting the EMG will need to know if you have certain medical conditions. Tell the neurologist and other EMG lab personnel if you: Have a pacemaker or any other electrical medical device Take blood-thinning medications Have hemophilia, a blood-clotting disorder that causes prolonged bleeding Bathing Take a shower or bath shortly before your exam in order to remove oils from your skin. Don't apply lotions or creams before the exam.  What to Expect You'll likely be asked to change into a hospital gown for the procedure and lie down on an examination table. The following explanations can help you understand what will happen during the exam.  Electrodes. The neurologist or a technician places surface electrodes at various locations on your skin depending on where you're experiencing symptoms. Or the neurologist may insert needle electrodes at different sites depending on your symptoms.  Sensations. The electrodes will at times transmit a tiny electrical current that you may feel as a twinge or spasm. The needle electrode may cause discomfort or pain that usually ends shortly after the needle is removed. If you are concerned about discomfort or pain, you may want to talk to the neurologist about taking a short break during the exam.  Instructions. During the needle EMG, the neurologist will assess whether there is any spontaneous electrical activity when the muscle is at rest - activity that isn't present in healthy muscle tissue - and the degree of activity when you slightly contract the muscle.  He or she will give you instructions on resting and contracting a muscle at appropriate times. Depending on what muscles and nerves the neurologist is examining, he or she may ask you to change positions during  the exam.  After your EMG You may experience some temporary, minor bruising where the needle electrode was inserted into your muscle. This bruising should fade within several days. If it persists, contact your primary care doctor.

## 2023-02-06 NOTE — Progress Notes (Signed)
[facility] Neurology [facility] Clinic Note - Initial Visit   Date: 02/06/2023   Tara Lewis MRN: [id number] DOB: 1966/04/03   Dear Tara Rim, FNP:  Thank you for your kind referral of Tara Lewis for consultation of bilateral feet pain. Although her history is well known to you, please allow [place] to reiterate it for the purpose of our medical record. The patient was accompanied to the clinic by boyfriend Tara Lewis) who also provides collateral information.     Tara Lewis is a 57 y.o. right-handed female with fibromyalgia, depression/anxiety, GERD, Graves, tobacco use, and alcohol abuse disease presenting for evaluation of bilateral feet pain.   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   - Check NCS/EMG of the legs  - Check vitamin B12, folate, vitamin B2, SPEP with IFE  - Stop Lyrica  - Start Cymbalta 30mg  daily  - Educated patient to reduce and abstain from alcohol.  Talk to PCP for assistance or consider out-patient detox program  2.  Low back pain seems musculoskeletal.  Exam does not show findings to suggest central canal stenosis. She does not have radicular symptoms.   - PT offered, pt declined  3.  Muscle jerks, possible medication-induced.  Abnormal movements were not present on exam today.  - Stop Lyrica and monitor  Return to clinic in 4 months  ------------------------------------------------------------- History of present illness: Starting in early 2024, she began having swelling and pain in the feet and was diagnosed with gout.  Around the spring, she began having tingling and numbness involving the toes.  Symptoms are constant and improved on Lyrica 50mg  twice daily, but not resolved.  Standing on her feet tends to exacerbate her pain.  She has weakness in the legs and has some imbalance.  No falls. Over the past few weeks, she has noticed involuntary muscle twitches of the  arms.  She also started having achy low back pain over the past few days.  No shooting pain in the legs.   She works as a Social worker.  She smokes 1-2 PPD x 40 years.  She drinks 3 bottles of liquor per week for the past 3 years.  Prior to this she was drinking alcohol, but only on the weekends.   Out-side paper records, electronic medical record, and images have been reviewed where available and summarized as:  Lab Results  Component Value Date   HGBA1C 4.9 06/12/2016   No results found for: "VITAMINB12" Lab Results  Component Value Date   TSH 0.430 12/08/2021    Past Medical History:  Diagnosis Date   Anxiety    Carpal tunnel syndrome    Depression    Fibromyalgia    GERD (gastroesophageal reflux disease)    Graves' disease     Past Surgical History:  Procedure Laterality Date   ABDOMINAL HYSTERECTOMY  2000s   KNEE SURGERY Right    OVARIAN CYST SURGERY       Medications:  Outpatient Encounter Medications as of 02/06/2023  Medication Sig   albuterol (VENTOLIN HFA) 108 (90 Base) MCG/ACT inhaler Inhale 2 puffs into the lungs every 6 (six) hours as needed for wheezing or shortness of breath.   dicyclomine (BENTYL) 10 MG capsule Take 10 mg by mouth daily.   levothyroxine (SYNTHROID) 88 MCG tablet TAKE 1 TABLET BY MOUTH ONCE DAILY BEFORE BREAKFAST   pregabalin (LYRICA) 50 MG capsule Take by mouth.   SYMBICORT 80-4.5 MCG/ACT inhaler SMARTSIG:2 Puff(s) By Mouth Twice Daily  esomeprazole (NEXIUM) 20 MG capsule Take 20 mg by mouth daily at 12 noon.   No facility-administered encounter medications on file as of 02/06/2023.    Allergies:  Allergies  Allergen Reactions   Codeine Other (See Comments)    Patient reports stomach pain.   Statins Other (See Comments)    Makes legs hurt    Family History: Family History  Problem Relation Age of Onset   Kidney disease Mother    Thyroid disease Mother    Fibromyalgia Mother    Asthma Mother    Hypertension Father    Heart  attack Father        3 heart attacks   Heart disease Father    Thyroid disease Sister    Fibromyalgia Sister     Social History: Social History   Tobacco Use   Smoking status: Every Day    Current packs/day: 0.50    Average packs/day: 0.5 packs/day for 34.0 years (17.0 ttl pk-yrs)    Types: Cigarettes   Smokeless tobacco: Never  Substance Use Topics   Alcohol use: Yes    Comment: 3-4 cans of beer/daily   Drug use: No   Social History   Social History Narrative   Are you right handed or left handed? Right Handed    Are you currently employed ? Yes   What is your current occupation? [employer]    Do you live at home alone? Yes   Who lives with you? Boyfriend stays with her sometimes    What type of home do you live in: 1 story or 2 story? Lives in a one story home         Vital Signs:  BP 98/64   Pulse 97   Ht 5' 7.5" (1.715 m)   Wt 132 lb (59.9 kg)   SpO2 91%   BMI 20.37 kg/m    Neurological Exam: MENTAL STATUS including orientation to time, place, person, recent and remote memory, attention span and concentration, language, and fund of knowledge is normal.  Speech is not dysarthric.  CRANIAL NERVES: II:  No visual field defects.     III-IV-VI: Pupils equal round and reactive to light.  Normal conjugate, extra-ocular eye movements in all directions of gaze.  No nystagmus.  No ptosis.   V:  Normal facial sensation.    VII:  Normal facial symmetry and movements.   VIII:  Normal hearing and vestibular function.   IX-X:  Normal palatal movement.   XI:  Normal shoulder shrug and head rotation.   XII:  Normal tongue strength and range of motion, no deviation or fasciculation.  MOTOR:  No atrophy, fasciculations or abnormal movements.  No pronator drift.   Upper Extremity:  Right  Left  Deltoid  5/5   5/5   Biceps  5/5   5/5   Triceps  5/5   5/5   Wrist extensors  5/5   5/5   Wrist flexors  5/5   5/5   Finger extensors  5/5   5/5   Finger flexors  5/5   5/5    Dorsal interossei  5/5   5/5   Abductor pollicis  5/5   5/5   Tone (Ashworth scale)  0  0   Lower Extremity:  Right  Left  Hip flexors  5-/5   5-/5   Knee flexors  5/5   5/5   Knee extensors  5/5   5/5   Dorsiflexors  5/5   5/5  Plantarflexors  5/5   5/5   Toe extensors  5/5   5/5   Toe flexors  5/5   5/5   Tone (Ashworth scale)  0  0   MSRs:                                           Right        Left brachioradialis 2+  2+  biceps 2+  2+  triceps 2+  2+  patellar 2+  2+  ankle jerk 0  0  Hoffman no  no  plantar response down  down   SENSORY:  Reduced vibration, temperature, and pin prick below the ankles.  Sensation intact in the hands.  Rhomberg testing is positive.  COORDINATION/GAIT: Normal finger-to- nose-finger.  Intact rapid alternating movements bilaterally.  Gait appears antalgic slightly stooped (complains of low back pain), unassisted, wide-based.      Thank you for allowing me to participate in patient's care.  If I can answer any additional questions, I would be pleased to do so.    Sincerely,    Rini Moffit K. Allena Katz, DO

## 2023-02-11 LAB — IMMUNOFIXATION ELECTROPHORESIS
IgA/Immunoglobulin A, Serum: 585 mg/dL — ABNORMAL HIGH (ref 87–352)
IgG (Immunoglobin G), Serum: 1479 mg/dL (ref 586–1602)
IgM (Immunoglobulin M), Srm: 188 mg/dL (ref 26–217)
Total Protein: 6.6 g/dL (ref 6.0–8.5)

## 2023-02-11 LAB — PROTEIN ELECTROPHORESIS, SERUM
A/G Ratio: 0.9 (ref 0.7–1.7)
Albumin ELP: 3.2 g/dL (ref 2.9–4.4)
Alpha 1: 0.3 g/dL (ref 0.0–0.4)
Alpha 2: 0.5 g/dL (ref 0.4–1.0)
Beta: 1 g/dL (ref 0.7–1.3)
Gamma Globulin: 1.6 g/dL (ref 0.4–1.8)
Globulin, Total: 3.4 g/dL (ref 2.2–3.9)

## 2023-02-11 LAB — VITAMIN B1: Thiamine: 89.6 nmol/L (ref 66.5–200.0)

## 2023-02-11 LAB — B12 AND FOLATE PANEL
Folate: 2.4 ng/mL — ABNORMAL LOW (ref >3.0)
Vitamin B-12: 1788 pg/mL — ABNORMAL HIGH (ref 232–1245)

## 2023-03-06 ENCOUNTER — Encounter: Payer: Self-pay | Admitting: Neurology

## 2023-03-15 ENCOUNTER — Encounter: Payer: Medicaid Other | Admitting: Neurology

## 2023-03-15 ENCOUNTER — Encounter: Payer: Self-pay | Admitting: Neurology

## 2023-04-02 ENCOUNTER — Encounter: Payer: Self-pay | Admitting: Gastroenterology

## 2023-04-02 ENCOUNTER — Ambulatory Visit: Payer: Medicaid Other | Admitting: Gastroenterology

## 2023-04-02 ENCOUNTER — Telehealth: Payer: Self-pay | Admitting: Neurology

## 2023-04-02 VITALS — BP 118/77 | HR 94 | Temp 98.6°F | Ht 66.0 in | Wt 127.4 lb

## 2023-04-02 DIAGNOSIS — R10816 Epigastric abdominal tenderness: Secondary | ICD-10-CM

## 2023-04-02 DIAGNOSIS — K529 Noninfective gastroenteritis and colitis, unspecified: Secondary | ICD-10-CM

## 2023-04-02 DIAGNOSIS — K2980 Duodenitis without bleeding: Secondary | ICD-10-CM | POA: Diagnosis not present

## 2023-04-02 DIAGNOSIS — K852 Alcohol induced acute pancreatitis without necrosis or infection: Secondary | ICD-10-CM

## 2023-04-02 DIAGNOSIS — A09 Infectious gastroenteritis and colitis, unspecified: Secondary | ICD-10-CM

## 2023-04-02 DIAGNOSIS — A0472 Enterocolitis due to Clostridium difficile, not specified as recurrent: Secondary | ICD-10-CM

## 2023-04-02 NOTE — Telephone Encounter (Signed)
Called patient and informed her of Dr. Eliane Decree response and recommendations. Patient stated she was able to pick up her Cymbalta yesterday and will contact her PCP about her depression/anxiety. Patient stated she has a follow up on 06/12/23 with Dr. Allena Katz and would like to be placed on a wait list. Patient also stated that she did have the EMG scheduled but had to cancel because she was in the hospital. Patient would like to wait til she sees dr. Allena Katz at her follow up to determine if she should have the EMG done. Patient had no further questions or concerns.

## 2023-04-02 NOTE — Telephone Encounter (Signed)
She needs to talk to her PCP about medication management for anxiety/depression, as I do not treat this condition.  I prescribed cymbalta for neuropathy in October with 3 refills.  She should still have refills, but I still suggest that she talk to her PCP for depression/anxiety.

## 2023-04-02 NOTE — Telephone Encounter (Signed)
Pt called in and left a message with the after hours nurses on 03/28/23. She just got out of the hospital on the 14th. She was taking a medication called Cymbalta. Supposed to have an appt but it had to be cancelled because she was in the hospital. She spoke to the nurse line was night and they wanted her to go to the ER. She was put on this med because of anxiety and depression. On her release papers from the hospital, it said to continue this medication. She has been wtihout the med since she got out of the hospital. She is crying all the time and cannot go back to work.  Full access nurse report is in Dr. Eliane Decree box

## 2023-04-02 NOTE — Patient Instructions (Addendum)
We will get you scheduled for a colonoscopy and upper endoscopy in the near future with Dr. Jena Gauss.  We will get you scheduled after we receive clearance from cardiology  Please have labs completed at [facility].  We will call you with results once they have been received. Please allow 3-5 business days for review. 2 locations for [facility] in [place]:              1. [address], [place]              2. [address] [place], [place]   Continue to avoid alcohol and any NSAIDs.  Congratulations on being alcohol free for 30 days!  Please continue taking your folic acid supplementation as well as your B12.  Follow-up will be determined after colonoscopy  It was a pleasure to see you today. I want to create trusting relationships with patients. If you receive a survey regarding your visit,  I greatly appreciate you taking time to fill this out on paper or through your MyChart. I value your feedback.  Brooke Bonito, MSN, FNP-BC, AGACNP-BC [facility]

## 2023-04-02 NOTE — Progress Notes (Signed)
GI Office Note    Referring Provider: Orlene Plum, NP Primary Care Physician:  Orlene Plum, NP  Primary Gastroenterologist: Gerrit Friends.Rourk, MD  Chief Complaint   Chief Complaint  Patient presents with   New Patient (Initial Visit)    Referred for colitis and pancreatitis   History of Present Illness   Tara Lewis is a 57 y.o. female presenting today at the request of Orlene Plum, NP for ED follow-up of colitis and pancreatitis.  Patient admitted to [facility] 03/06/2023.  Admission CT with evidence of hepatic steatosis, mild diffuse colonic wall thickening, possible mild acute infectious or inflammatory colitis cannot be excluded.  She underwent CT imaging of the abdomen pelvis with contrast on 11/10 that revealed mild diffuse colonic wall thickening related to chronic amatory changes versus colitis, mild fat stranding inferior to the duodenum extending into the retroperitoneum, possible duodenitis.  Also free small fluid in the right pericolic gutter and pelvis, and hepatic steatosis.  Lipase was elevated on admission despite no concerning CT findings of the pancreas.  Per discharge notes she was reported to have echoic focal results of C. difficile testing and elected to treat given her persistence of symptoms given she only had mild improvement on Flagyl.  She was treated with vancomycin and encouraged to have outpatient GI follow-up.  Etiology of colitis is unclear as infection versus ischemia.  She was treated for alcohol-induced pancreatitis with CIWA protocol and fluids.  There was no hematochezia present.  She was noted to have macrocytic anemia and was given folate, thiamine, and B12 supplementation.   Today:  Friend and caretaker helps to provide history.  He reports patient was not eating and drinking well prior to ED and reports significant hydration in the hospital. She was having severe abdominal pain. They report inconsistent Cdiff results and  they ultimately decided to treat her for Cdiff. Prior to all this she had chronic diarrhea. Today her stomach always aches but after eating its worse. Her appetite has increased significantly. Has constant hiccupping as well. Diarrhea is getting better but still there. Prior to all this she was having 6-7 loose stools daily. Also has a thyroid issues as well. She was diagnosed with graves disease years ago and received radiation and sometimes she has large amounts of energy and some days none. She did note rectal bleeding sometimes prior to hospitalization. Received 6 days of at home vancomycin. Denied previous antibiotics from hospitalization. Currently sometimes having no diarrhea but did have regular Bms.   Has belching and hiccupping. No burning symptoms. No N/V since discharge, was previously not having them.   Has not had a drink in 30 days. Will start PT tomorrow. Is due to see urology and cardiology and a spine specialist as well. In the near future. Sees endocrine on 12/20.   Denies NSAID use.  Denies dysphagia.  Had some tachycardia and low BP previously. Has constant shortness of breath and uses albuterol and Symbicort. Awaiting nebulizer treatment. 1.5 ppd smoker - going to try a nicotene vape.   Wt Readings from Last 3 Encounters:  04/02/23 127 lb 6.4 oz (57.8 kg)  02/06/23 132 lb (59.9 kg)  12/13/21 136 lb 12.8 oz (62.1 kg)    Current Outpatient Medications  Medication Sig Dispense Refill   albuterol (VENTOLIN HFA) 108 (90 Base) MCG/ACT inhaler Inhale 2 puffs into the lungs every 6 (six) hours as needed for wheezing or shortness of breath. 6.7 g 0   allopurinol (ZYLOPRIM)  300 MG tablet Take 300 mg by mouth daily.     B Complex Vitamins (B COMPLEX-B12) TABS Take by mouth.     diazepam (VALIUM) 2 MG tablet Take 2 mg by mouth every 6 (six) hours as needed for anxiety.     dicyclomine (BENTYL) 10 MG capsule Take 10 mg by mouth daily.     DULoxetine (CYMBALTA) 30 MG capsule Take 1  capsule (30 mg total) by mouth daily. 30 capsule 3   folic acid (FOLVITE) 1 MG tablet Take 1 mg by mouth daily.     levothyroxine (SYNTHROID) 88 MCG tablet TAKE 1 TABLET BY MOUTH ONCE DAILY BEFORE BREAKFAST 90 tablet 0   SYMBICORT 80-4.5 MCG/ACT inhaler SMARTSIG:2 Puff(s) By Mouth Twice Daily     esomeprazole (NEXIUM) 20 MG capsule Take 20 mg by mouth daily at 12 noon.     No current facility-administered medications for this visit.    Past Medical History:  Diagnosis Date   Anxiety    Carpal tunnel syndrome    Depression    Fibromyalgia    GERD (gastroesophageal reflux disease)    Graves' disease     Past Surgical History:  Procedure Laterality Date   ABDOMINAL HYSTERECTOMY  2000s   KNEE SURGERY Right    OVARIAN CYST SURGERY      Family History  Problem Relation Age of Onset   Kidney disease Mother    Thyroid disease Mother    Fibromyalgia Mother    Asthma Mother    Hypertension Father    Heart attack Father        3 heart attacks   Heart disease Father    Thyroid disease Sister    Fibromyalgia Sister     Allergies as of 04/02/2023 - Review Complete 04/02/2023  Allergen Reaction Noted   Codeine Other (See Comments) 05/11/2016   Statins Other (See Comments) 05/17/2017    Social History   Socioeconomic History   Marital status: Widowed    Spouse name: Not on file   Number of children: Not on file   Years of education: Not on file   Highest education level: Not on file  Occupational History   Not on file  Tobacco Use   Smoking status: Every Day    Current packs/day: 0.50    Average packs/day: 0.5 packs/day for 34.0 years (17.0 ttl pk-yrs)    Types: Cigarettes   Smokeless tobacco: Never  Substance and Sexual Activity   Alcohol use: Yes    Comment: 3-4 cans of beer/daily   Drug use: No   Sexual activity: Never    Birth control/protection: Surgical    Comment: last time approx october 2017  Other Topics Concern   Not on file  Social History Narrative    Are you right handed or left handed? Right Handed    Are you currently employed ? Yes   What is your current occupation? [employer]    Do you live at home alone? Yes   Who lives with you? Boyfriend stays with her sometimes    What type of home do you live in: 1 story or 2 story? Lives in a one story home        Social Determinants of Health   Financial Resource Strain: Not on file  Food Insecurity: No Food Insecurity (03/07/2023)   Received from [facility]   Hunger Vital Sign    Worried About Running Out of Food in the Last Year: Never true  Ran Out of Food in the Last Year: Never true  Transportation Needs: No Transportation Needs (03/07/2023)   Received from [facility] - Transportation    Lack of Transportation (Medical): No    Lack of Transportation (Non-Medical): No  Physical Activity: Not on file  Stress: Not on file  Social Connections: Unknown (02/22/2023)   Received from [facility]   Social Network    Social Network: Not on file  Intimate Partner Violence: Not At Risk (03/07/2023)   Received from [facility]   Humiliation, Afraid, Rape, and Kick questionnaire    Fear of Current or Ex-Partner: No    Emotionally Abused: No    Physically Abused: No    Sexually Abused: No     Review of Systems   Gen: Denies any fever, chills, fatigue, weight loss, lack of appetite.  CV: Denies chest pain, heart palpitations, peripheral edema, syncope.  Resp: Denies shortness of breath at rest or with exertion. Denies wheezing or cough.  GI: see HPI GU : Denies urinary burning, urinary frequency, urinary hesitancy MS: Denies joint pain, muscle weakness, cramps, or limitation of movement.  Derm: Denies rash, itching, dry skin Psych: Denies depression, anxiety, memory loss, and confusion Heme: Denies bruising, bleeding, and enlarged lymph nodes.  Physical Exam   BP 118/77   Pulse 94   Temp 98.6 F (37 C)   Ht 5\' 6"  (1.676 m)   Wt 127 lb 6.4 oz  (57.8 kg)   BMI 20.56 kg/m   General:   Alert and oriented. Pleasant and cooperative. Well-nourished and well-developed.  Head:  Normocephalic and atraumatic. Eyes:  Without icterus, sclera clear and conjunctiva pink.  Ears:  Normal auditory acuity. Mouth:  No deformity or lesions, oral mucosa pink.  Lungs:  Clear to auscultation bilaterally. No wheezes, rales, or rhonchi. No distress.  Heart:  S1, S2 present without murmurs appreciated.  Abdomen:  +BS, soft, non-tender and non-distended. No HSM noted. No guarding or rebound. No masses appreciated.  Rectal:  Deferred  Msk:  Symmetrical without gross deformities. Normal posture. Extremities:  Without edema. Neurologic:  Alert and  oriented x4;  grossly normal neurologically. Skin:  Intact without significant lesions or rashes. Psych:  Alert and cooperative. Normal mood and affect.  Assessment   Tara Lewis is a 57 y.o. female with a history of anxiety, hypertension, GERD, Graves' disease s/p radiation and ongoing thyroid disease, depression, and fibromyalgia presenting today for hospital follow-up of colitis, duodenitis, pancreatitis as well as possible C. difficile infection.   Colitis, C. difficile: Recent hospitalization at [facility] with evidence of colitis as well as enteritis of unknown etiology.  C. difficile testing was inconclusive however she was treated empirically with vancomycin given she failed metronidazole.  Since completing vancomycin she is starting to have improvement in bowel function as she is having less diarrhea and has been having some more formed stools although still going about 3 times per day.  Also is having less abdominal pain but still with some mild lower abdominal tenderness today on exam.  Colitis etiology is unclear at this time given that this could be alcohol induced somewhat as well as possible component of ischemia given her prior large amount of weight loss.  Will evaluate her chronic diarrhea further  with ruling out pancreatic insufficiency given concerns for pancreatitis while inpatient as well.  Will also reassess fecal calprotectin to see if there is still any active inflammation within the colon.  She will be  further evaluated with a colonoscopy in about 6 weeks pending schedule availability.  We are also requesting cardiac clearance given concerns for lower blood pressure and tachycardia at times.  Duodenitis: Evidence of duodenitis on CT scan.  Has had some upper abdominal discomfort and a history of chronic reflux.  Given her history of alcohol abuse there is potential for alcohol induced duodenitis and potentially gastritis as well.  She continues to have epigastric tenderness today on exam which also could be related to her recent pancreatitis.  I encouraged her to continue her folic acid and B12 supplementation.  Given the duodenitis findings we will further evaluate with an upper endoscopy at the same time as her colonoscopy.  Pancreatitis: No overt imaging findings of pancreatitis on recent scans however she did have elevated lipase and was having ongoing abdominal pain as well as nausea and vomiting.  She did improve somewhat with hydration and now her appetite has much improved since previous.  As noted above given concerns for prior diarrhea and even some recent diarrhea despite finishing vancomycin up until recently we will evaluate for pancreatic insufficiency.  PLAN   Complete alcohol cessation and avoid NSAIDs.  Proceed with EGD and colonoscopy with propofol by Dr. Jena Gauss in near future: the risks, benefits, and alternatives have been discussed with the patient in detail. The patient states understanding and desires to proceed. ASA 4 Cardiac clearance - medical [facility] cardiology) Hold bentyl for 5 days prior.  Fecal elastase, fecal calprotectin Continue folic acid and B12 Hold on PPI right now given recent cdiff infection. Follow up post colonoscopy.   Brooke Bonito, MSN,  FNP-BC, AGACNP-BC [facility]

## 2023-04-03 ENCOUNTER — Other Ambulatory Visit: Payer: Self-pay | Admitting: *Deleted

## 2023-04-03 ENCOUNTER — Telehealth: Payer: Self-pay | Admitting: *Deleted

## 2023-04-03 DIAGNOSIS — L819 Disorder of pigmentation, unspecified: Secondary | ICD-10-CM

## 2023-04-03 DIAGNOSIS — I959 Hypotension, unspecified: Secondary | ICD-10-CM

## 2023-04-03 NOTE — Telephone Encounter (Signed)
Referral placed.

## 2023-04-03 NOTE — Addendum Note (Signed)
Addended by: Armstead Peaks on: 04/03/2023 03:20 PM   Modules accepted: Orders

## 2023-04-03 NOTE — Telephone Encounter (Signed)
Patient called in and left a VM stating we need to refer her to the cardiologists since we are wanting her to have a procedure done. She requested we send her to [facility] in [place]. She reports [facility] told her they also would not accept her referral since it was from her ortho doctor and that is why she never was scheduled. Please advise Tara Lewis thanks!

## 2023-04-06 ENCOUNTER — Encounter: Payer: Self-pay | Admitting: Physician Assistant

## 2023-04-06 ENCOUNTER — Ambulatory Visit: Payer: Medicaid Other

## 2023-04-06 ENCOUNTER — Encounter: Payer: Self-pay | Admitting: Internal Medicine

## 2023-04-06 ENCOUNTER — Ambulatory Visit (HOSPITAL_COMMUNITY)
Admission: RE | Admit: 2023-04-06 | Discharge: 2023-04-06 | Disposition: A | Discharge status: Home / Self Care | Payer: Medicaid Other | Source: Ambulatory Visit | Attending: Vascular Surgery | Admitting: Vascular Surgery

## 2023-04-06 ENCOUNTER — Telehealth: Payer: Self-pay | Admitting: Internal Medicine

## 2023-04-06 ENCOUNTER — Ambulatory Visit: Payer: Medicaid Other | Admitting: Physician Assistant

## 2023-04-06 ENCOUNTER — Ambulatory Visit: Payer: Medicaid Other | Attending: Internal Medicine | Admitting: Internal Medicine

## 2023-04-06 ENCOUNTER — Other Ambulatory Visit: Payer: Self-pay | Admitting: Internal Medicine

## 2023-04-06 VITALS — BP 110/70 | HR 100 | Ht 65.75 in | Wt 128.0 lb

## 2023-04-06 VITALS — HR 95 | Temp 97.7°F | Ht 65.75 in | Wt 128.5 lb

## 2023-04-06 DIAGNOSIS — M7989 Other specified soft tissue disorders: Secondary | ICD-10-CM

## 2023-04-06 DIAGNOSIS — R42 Dizziness and giddiness: Secondary | ICD-10-CM

## 2023-04-06 DIAGNOSIS — R002 Palpitations: Secondary | ICD-10-CM | POA: Diagnosis not present

## 2023-04-06 DIAGNOSIS — Z136 Encounter for screening for cardiovascular disorders: Secondary | ICD-10-CM

## 2023-04-06 DIAGNOSIS — Z01810 Encounter for preprocedural cardiovascular examination: Secondary | ICD-10-CM

## 2023-04-06 DIAGNOSIS — L819 Disorder of pigmentation, unspecified: Secondary | ICD-10-CM

## 2023-04-06 MED ORDER — PEG 3350-KCL-NA BICARB-NACL 420 G PO SOLR
4000.0000 mL | Freq: Once | ORAL | 0 refills | Status: AC
Start: 2023-04-06 — End: 2023-04-06

## 2023-04-06 NOTE — Patient Instructions (Addendum)
Medication Instructions:  Your physician recommends that you continue on your current medications as directed. Please refer to the Current Medication list given to you today.   Labwork: None  Testing/Procedures: Your physician has recommended that you wear a Zio monitor.   This monitor is a medical device that records the heart's electrical activity. Doctors most often use these monitors to diagnose arrhythmias. Arrhythmias are problems with the speed or rhythm of the heartbeat. The monitor is a small device applied to your chest. You can wear one while you do your normal daily activities. While wearing this monitor if you have any symptoms to push the button and record what you felt. Once you have worn this monitor for the period of time provider prescribed (for 14 days), you will return the monitor device in the postage paid box. Once it is returned they will download the data collected and provide [place] with a report which the provider will then review and we will call you with those results. Important tips:  Avoid showering during the first 24 hours of wearing the monitor. Avoid excessive sweating to help maximize wear time. Do not submerge the device, no hot tubs, and no swimming pools. Keep any lotions or oils away from the patch. After 24 hours you may shower with the patch on. Take brief showers with your back facing the shower head.  Do not remove patch once it has been placed because that will interrupt data and decrease adhesive wear time. Push the button when you have any symptoms and write down what you were feeling. Once you have completed wearing your monitor, remove and place into box which has postage paid and place in your outgoing mailbox.  If for some reason you have misplaced your box then call our office and we can provide another box and/or mail it off for you.   Follow-Up: Your physician recommends that you schedule a follow-up appointment in: 6 months  Any Other Special  Instructions Will Be Listed Below (If Applicable).  If you need a refill on your cardiac medications before your next appointment, please call your pharmacy.

## 2023-04-06 NOTE — Telephone Encounter (Signed)
Called pt and scheduled for 05/09/23. Aware will send instructions. Rx for prep to be sent to pharmacy. Also aware will need pre-op appt prior and will let know once scheduled. She does have access to [employer]. Insurance will be same next year.,

## 2023-04-06 NOTE — Progress Notes (Signed)
Cardiology Office Note  Date: 04/06/2023   ID: Tara Lewis, DOB 1965/06/14, MRN [id number]  PCP:  Orlene Plum, NP  Cardiologist:  Marjo Bicker, MD Electrophysiologist:  None   History of Present Illness: Tara Lewis is a 56 y.o. female known to have Graves' disease s/p RAI ablation c/w hypothyroidism, GERD was referred to cardiology clinic for evaluation of hypotension.  Patient had decreased p.o. intake for 30 days prior to 03/06/2023 hospitalization at [facility]. She did not have any food for 2 weeks prior to the hospitalization. She was also drinking alcohol every day and has having abdominal pain. She also felt dizzy when she was getting up and walking, her BP was noted to be extremely low, 80s mm Hg SBP.  She was admitted at [facility] in 03/2023 for management of alcohol induced acute pancreatitis and colitis. Her TSH levels were noted to be significantly elevated. Cardiology referral was placed prior to [facility] hospitalization and she got an appointment today. Her blood pressure is significantly improved after discharge from the hospital, currently 110/70 mmHg and at home as well.  Denies having angina.  She continues to feel dizzy when she gets up but improved compared to prior hospitalization.  No syncope.  She does have palpitations and her EKG today showed normal sinus rhythm, HR 97 bpm and no ischemia.  She said her resting heart rate is around 100s.  No prior history of CAD.  She smokes cigarettes.  She is sober for 30 days now.  Past Medical History:  Diagnosis Date   Anxiety    Carpal tunnel syndrome    Depression    Fibromyalgia    GERD (gastroesophageal reflux disease)    Graves' disease     Past Surgical History:  Procedure Laterality Date   ABDOMINAL HYSTERECTOMY  2000s   KNEE SURGERY Right    OVARIAN CYST SURGERY      Current Outpatient Medications  Medication Sig Dispense Refill   albuterol (VENTOLIN HFA) 108 (90 Base) MCG/ACT inhaler Inhale 2  puffs into the lungs every 6 (six) hours as needed for wheezing or shortness of breath. 6.7 g 0   allopurinol (ZYLOPRIM) 300 MG tablet Take 300 mg by mouth daily.     B Complex Vitamins (B COMPLEX-B12) TABS Take by mouth.     cyanocobalamin (VITAMIN B12) 1000 MCG tablet Take 1 tablet by mouth daily.     diazepam (VALIUM) 2 MG tablet Take 2 mg by mouth every 6 (six) hours as needed for anxiety.     dicyclomine (BENTYL) 10 MG capsule Take 10 mg by mouth daily.     DULoxetine (CYMBALTA) 30 MG capsule Take 1 capsule (30 mg total) by mouth daily. 30 capsule 3   folic acid (FOLVITE) 1 MG tablet Take 1 mg by mouth daily.     levothyroxine (SYNTHROID) 75 MCG tablet Take 75 mcg by mouth daily before breakfast.     SYMBICORT 80-4.5 MCG/ACT inhaler SMARTSIG:2 Puff(s) By Mouth Twice Daily     colchicine 0.6 MG tablet SMARTSIG:2 Tablet(s) By Mouth Every 12 Hours (Patient not taking: Reported on 04/06/2023)     levothyroxine (SYNTHROID) 88 MCG tablet TAKE 1 TABLET BY MOUTH ONCE DAILY BEFORE BREAKFAST (Patient not taking: Reported on 04/06/2023) 90 tablet 0   methocarbamol (ROBAXIN) 500 MG tablet Take 1,000 mg by mouth 3 (three) times daily. (Patient not taking: Reported on 04/06/2023)     ondansetron (ZOFRAN-ODT) 4 MG disintegrating tablet SMARTSIG:1 Tablet(s) By Mouth Every 12 Hours  PRN (Patient not taking: Reported on 04/06/2023)     No current facility-administered medications for this visit.   Allergies:  Codeine and Statins   Social History: The patient  reports that she has been smoking cigarettes. She has a 17 pack-year smoking history. She has never used smokeless tobacco. She reports that she does not currently use alcohol. She reports that she does not use drugs.   Family History: The patient's family history includes Asthma in her mother; Fibromyalgia in her mother and sister; Heart attack in her father; Heart disease in her father; Hypertension in her father; Kidney disease in her mother; Thyroid  disease in her mother and sister.   ROS:  Please see the history of present illness. Otherwise, complete review of systems is positive for none  All other systems are reviewed and negative.   Physical Exam: VS:  BP 110/70   Pulse 100   Ht 5' 5.75" (1.67 m)   Wt 128 lb (58.1 kg)   SpO2 92%   BMI 20.82 kg/m , BMI Body mass index is 20.82 kg/m.  Wt Readings from Last 3 Encounters:  04/06/23 128 lb (58.1 kg)  04/02/23 127 lb 6.4 oz (57.8 kg)  02/06/23 132 lb (59.9 kg)    General: Patient appears comfortable at rest. HEENT: Conjunctiva and lids normal, oropharynx clear with moist mucosa. Neck: Supple, no elevated JVP or carotid bruits, no thyromegaly. Lungs: Clear to auscultation, nonlabored breathing at rest. Cardiac: Regular rate and rhythm, no S3 or significant systolic murmur, no pericardial rub. Abdomen: Soft, nontender, no hepatomegaly, bowel sounds present, no guarding or rebound. Extremities: No pitting edema, distal pulses 2+. Skin: Warm and dry. Musculoskeletal: No kyphosis. Neuropsychiatric: Alert and oriented x3, affect grossly appropriate.  Recent Labwork: No results found for requested labs within last 365 days.     Component Value Date/Time   CHOL 297 (H) 07/19/2020 0815   TRIG 58 07/19/2020 0815   HDL 110 07/19/2020 0815   CHOLHDL 2.7 07/19/2020 0815   VLDL 12 07/19/2020 0815   LDLCALC 175 (H) 07/19/2020 0815     Assessment and Plan:  Dizziness likely secondary to decreased p.o. intake Palpitations, rule out atrial arrhythmias Preop cardiac risk stratification for colonoscopy and EGD Graves' disease s/p RAI ablation c/w hypothyroidism Nicotine abuse    -Patient had hypotension (SBP 80 mmHg) and tachycardia (HR 90-100s) for 30 days prior to hospitalization at [facility] in 11/24 likely secondary to decreased p.o. intake, excessive alcohol use and severe hypothyroidism.  She was admitted to [facility] 11/24 and treated for alcohol induced acute pancreatitis and  colitis, symptoms significantly improved after discharge.  BP around 110/70 mmHg now and at home, HR stays around 90s.  Strongly encouraged p.o. hydration and eating healthy meals.  Sober for 30 days now, Child psychotherapist.  Will obtain 2-week event monitor to rule out any intermittent atrial arrhythmias as she continues to have intermittent palpitations. -Patient is at a low risk for any perioperative cardiac complications for a low risk procedure like EGD and colonoscopy. -Smoking cessation counseling provided.   I spent 45 minutes reviewing records from Care Everywhere including admission at [facility], GI progress note, labs, imaging studies, discussed the management and evaluation of dizziness, palpitations and polysubstance abuse, discussed preop recommendations.   Medication Adjustments/Labs and Tests Ordered: Current medicines are reviewed at length with the patient today. Concerns regarding medicines are outlined above.    Disposition:  Follow up  6 months  Signed Meia Emley Verne Spurr, MD, 04/06/2023 9:24  AM    [facility] at [facility] [address] [place], [place], [place] [address]

## 2023-04-06 NOTE — Telephone Encounter (Signed)
Patient called stating she got a call from our office ask her who was doing her colonoscopy.  She states her clearance needs to go to [place] at [facility], her number is [phone].

## 2023-04-06 NOTE — Addendum Note (Signed)
Addended by: Armstead Peaks on: 04/06/2023 10:26 AM   Modules accepted: Orders

## 2023-04-06 NOTE — Telephone Encounter (Signed)
Message Received: Today Aida Raider, NP  Levester Waldridge, Ewell Poe, CMA; Elinor Dodge, LPN Reviewed cardiology note stating low risk for colonoscopy and EGD.  Okay to schedule procedures.

## 2023-04-06 NOTE — Progress Notes (Signed)
VASCULAR [facility] VEIN SPECIALISTS           OF [place]  History and Physical   Tara Lewis is a 57 y.o. female who presents with BLE swelling.  She was seen by Neurology in October 2024 for swelling and pain in her feet bilaterally. She had previously been diagnosed with gout.  She was having neuropathy sx and on Lyrica.  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  She was subsequently admitted to [facility] and found to have alcohol induced pancreatitis and colitis.  She also had hypotension.  She was seen by Cardiology this morning and her hypotension was improved.  She was found to be low risk for any perioperative cardiac complications like EGD or colonoscopy.  She will be wearing event monitor for 2 weeks given her palpitations.    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   Her legs would get achy and tired.  She is currently unable to work due to deconditioning due to the above.  She does have family hx of varicose veins.  She has never had DVT.  She has not worn compression socks.  She does not have any skin color changes in her lower legs.    She states that she is working with PT 3x/week.  She has not had any alcohol in a month and I praised her for this.  She continues to smoke but wants to quit.    She has a 57 year old Chiweenie named [place].   The pt is not on a statin for cholesterol management.  The pt is not on a daily aspirin.   Other AC:  none The pt is not on medication for hypertension.   The pt is not on medication for diabetes.   Tobacco hx:  current  Pt does not have family hx of AAA.  Past Medical History:  Diagnosis Date   Anxiety    Carpal tunnel syndrome    Depression    Fibromyalgia    GERD (gastroesophageal reflux disease)    Graves' disease     Past Surgical History:  Procedure  Laterality Date   ABDOMINAL HYSTERECTOMY  2000s   KNEE SURGERY Right    OVARIAN CYST SURGERY      Social History   Socioeconomic History   Marital status: Widowed    Spouse name: Not on file   Number of children: Not on file   Years of education: Not on file   Highest education level: Not on file  Occupational History   Not on file  Tobacco Use   Smoking status: Every Day    Current packs/day: 0.50    Average packs/day: 0.5 packs/day for 34.0 years (17.0 ttl pk-yrs)    Types: Cigarettes   Smokeless tobacco: Never  Vaping Use   Vaping status: Some Days   Substances: Nicotine  Substance and Sexual Activity   Alcohol use: Not Currently    Comment: 3-4 cans of beer/daily   Drug use: No   Sexual activity: Never    Birth control/protection: Surgical    Comment: last time approx october 2017  Other Topics Concern   Not on file  Social History Narrative   Are you right handed or left handed? Right Handed    Are you currently employed ? Yes  What is your current occupation? [employer]    Do you live at home alone? Yes   Who lives with you? Boyfriend stays with her sometimes    What type of home do you live in: 1 story or 2 story? Lives in a one story home        Social Determinants of Health   Financial Resource Strain: Not on file  Food Insecurity: No Food Insecurity (03/07/2023)   Received from [facility]   Hunger Vital Sign    Worried About Running Out of Food in the Last Year: Never true    Ran Out of Food in the Last Year: Never true  Transportation Needs: No Transportation Needs (03/07/2023)   Received from [facility] - Transportation    Lack of Transportation (Medical): No    Lack of Transportation (Non-Medical): No  Physical Activity: Not on file  Stress: Not on file  Social Connections: Unknown (02/22/2023)   Received from [facility]   Social Network    Social Network: Not on file  Intimate Partner Violence: Not At Risk (03/07/2023)    Received from [facility]   Humiliation, Afraid, Rape, and Kick questionnaire    Fear of Current or Ex-Partner: No    Emotionally Abused: No    Physically Abused: No    Sexually Abused: No    Family History  Problem Relation Age of Onset   Kidney disease Mother    Thyroid disease Mother    Fibromyalgia Mother    Asthma Mother    Hypertension Father    Heart attack Father        3 heart attacks   Heart disease Father    Thyroid disease Sister    Fibromyalgia Sister     Current Outpatient Medications  Medication Sig Dispense Refill   albuterol (VENTOLIN HFA) 108 (90 Base) MCG/ACT inhaler Inhale 2 puffs into the lungs every 6 (six) hours as needed for wheezing or shortness of breath. 6.7 g 0   allopurinol (ZYLOPRIM) 300 MG tablet Take 300 mg by mouth daily.     B Complex Vitamins (B COMPLEX-B12) TABS Take by mouth.     colchicine 0.6 MG tablet SMARTSIG:2 Tablet(s) By Mouth Every 12 Hours (Patient not taking: Reported on 04/06/2023)     cyanocobalamin (VITAMIN B12) 1000 MCG tablet Take 1 tablet by mouth daily.     diazepam (VALIUM) 2 MG tablet Take 2 mg by mouth every 6 (six) hours as needed for anxiety.     dicyclomine (BENTYL) 10 MG capsule Take 10 mg by mouth daily.     DULoxetine (CYMBALTA) 30 MG capsule Take 1 capsule (30 mg total) by mouth daily. 30 capsule 3   folic acid (FOLVITE) 1 MG tablet Take 1 mg by mouth daily.     levothyroxine (SYNTHROID) 75 MCG tablet Take 75 mcg by mouth daily before breakfast.     levothyroxine (SYNTHROID) 88 MCG tablet TAKE 1 TABLET BY MOUTH ONCE DAILY BEFORE BREAKFAST (Patient not taking: Reported on 04/06/2023) 90 tablet 0   methocarbamol (ROBAXIN) 500 MG tablet Take 1,000 mg by mouth 3 (three) times daily. (Patient not taking: Reported on 04/06/2023)     ondansetron (ZOFRAN-ODT) 4 MG disintegrating tablet SMARTSIG:1 Tablet(s) By Mouth Every 12 Hours PRN (Patient not taking: Reported on 04/06/2023)     polyethylene glycol-electrolytes  (NULYTELY) 420 g solution Take 4,000 mLs by mouth once for 1 dose. 4000 mL 0   SYMBICORT 80-4.5  MCG/ACT inhaler SMARTSIG:2 Puff(s) By Mouth Twice Daily     No current facility-administered medications for this visit.    Allergies  Allergen Reactions   Codeine Other (See Comments)    Patient reports stomach pain.   Statins Other (See Comments)    Makes legs hurt    REVIEW OF SYSTEMS:   [X]  denotes positive finding, [ ]  denotes negative finding Cardiac  Comments:  Chest pain or chest pressure:    Shortness of breath upon exertion:    Short of breath when lying flat:    Irregular heart rhythm:        Vascular    Pain in calf, thigh, or hip brought on by ambulation: x   Pain in feet at night that wakes you up from your sleep:     Blood clot in your veins:    Leg swelling:  x       Pulmonary    Oxygen at home:    Productive cough:     Wheezing:  x Having breathing machine delivered      Neurologic     weakness in  legs:  x   Sudden numbness in arms or legs:     Sudden onset of difficulty speaking or slurred speech:    Temporary loss of vision in one eye:     Problems with dizziness:  x       Gastrointestinal    Blood in stool:     Vomited blood:         Genitourinary    Burning when urinating:     Blood in urine:        Psychiatric    Major depression:  x       Hematologic    Bleeding problems:    Problems with blood clotting too easily:        Skin    Rashes or ulcers:        Constitutional    Fever or chills: x     PHYSICAL EXAMINATION:  Today's Vitals   04/06/23 1426  Pulse: 95  Temp: 97.7 F (36.5 C)  TempSrc: Temporal  SpO2: 90%  Weight: 128 lb 8 oz (58.3 kg)  Height: 5' 5.75" (1.67 m)   Body mass index is 20.9 kg/m.   General:  WDWN in NAD; vital signs documented above Gait: Not observed-in wheelchair HENT: WNL, normocephalic Pulmonary: normal non-labored breathing without wheezing Cardiac: regular HR; without carotid  bruits Abdomen: soft, NT, aortic pulse is not palpable Skin: without rashes Vascular Exam/Pulses:  Right Left  Radial 2+ (normal) 2+ (normal)  DP 2+ (normal) 2+ (normal)  PT Brisk biphasic Brisk biphasic   Extremities: minimal swelling  Neurologic: A&O X 3;  moving all extremities equally Psychiatric:  The pt has Normal affect.   Non-Invasive Vascular Imaging:   Venous duplex on 04/06/2023: Venous Reflux Times  +--------------+---------+------+-----------+------------+--------+  RIGHT        Reflux NoRefluxReflux TimeDiameter cmsComments                          Yes                                   +--------------+---------+------+-----------+------------+--------+  CFV                    yes   >1 second                       +--------------+---------+------+-----------+------------+--------+  FV mid        no                                              +--------------+---------+------+-----------+------------+--------+  Popliteal              yes   >1 second                       +--------------+---------+------+-----------+------------+--------+  GSV at [facility]    no                            0.53              +--------------+---------+------+-----------+------------+--------+  GSV prox thigh          yes    >500 ms      0.28              +--------------+---------+------+-----------+------------+--------+  GSV mid thigh           yes    >500 ms      0.18              +--------------+---------+------+-----------+------------+--------+  GSV dist thighno                            0.20              +--------------+---------+------+-----------+------------+--------+  GSV at knee   no                            0.15              +--------------+---------+------+-----------+------------+--------+  SSV Pop Fossa no                            0.18               +--------------+---------+------+-----------+------------+--------+   Summary:  Right:  - No evidence of deep vein thrombosis from the common femoral through the popliteal veins.  - No evidence of superficial venous thrombosis.  - The deep venous system is not competent.  - The great saphenous vein is not competent (proximal and mid thigh only).  -The small saphenous vein is competent.     Tara Lewis is a 57 y.o. female who presents with: BLE swelling with right worse than left    -pt has palpable DP pedal pulses bilaterally and brisk doppler flow -pt does not have evidence of DVT.  Pt does have venous reflux in the right deep venous system as well as the GSV in the proximal and mid thigh but not at the [facility].  She is not a candidate for laser ablation -discussed with pt about wearing knee high 15-20 mmHg compression stockings and pt was measured for these today.    -discussed the importance of leg elevation and how to elevate properly - pt is advised to elevate their legs and a diagram is given to them to demonstrate for pt to lay flat on their back with knees elevated and slightly bent with their feet higher than their knees, which puts their feet higher than their heart for 15 minutes per day.  If pt cannot lay flat, advised to  lay as flat as possible.  -pt is deconditioned from recent hospitalization and health issues and is working with PT 3x/week.  Encouraged her to stay active and try to avoid prolonged sitting or standing as able.  -handout with recommendations given -pt will f/u as needed -discussed importance of smoking cessation and she does want to quit.     Doreatha Massed, [facility] [phone]  Clinic MD:  Hetty Blend on call MD

## 2023-04-09 LAB — CALPROTECTIN, FECAL: Calprotectin, Fecal: 26 ug/g (ref 0–120)

## 2023-04-09 LAB — PANCREATIC ELASTASE, FECAL

## 2023-04-09 NOTE — Telephone Encounter (Signed)
Noted. We have already called/spoke with pt.

## 2023-05-04 ENCOUNTER — Encounter (HOSPITAL_COMMUNITY): Payer: Self-pay

## 2023-05-04 ENCOUNTER — Encounter (HOSPITAL_COMMUNITY)
Admission: RE | Admit: 2023-05-04 | Discharge: 2023-05-04 | Disposition: A | Discharge status: Home / Self Care | Payer: Medicaid Other | Source: Ambulatory Visit | Attending: Internal Medicine

## 2023-05-04 VITALS — Ht 66.0 in | Wt 135.0 lb

## 2023-05-04 DIAGNOSIS — Z01812 Encounter for preprocedural laboratory examination: Secondary | ICD-10-CM | POA: Diagnosis present

## 2023-05-04 DIAGNOSIS — F1010 Alcohol abuse, uncomplicated: Secondary | ICD-10-CM | POA: Diagnosis not present

## 2023-05-04 HISTORY — DX: Chronic obstructive pulmonary disease, unspecified: J44.9

## 2023-05-04 HISTORY — DX: Hypothyroidism, unspecified: E03.9

## 2023-05-04 LAB — COMPREHENSIVE METABOLIC PANEL
ALT: 19 U/L (ref 0–44)
AST: 24 U/L (ref 15–41)
Albumin: 3.6 g/dL (ref 3.5–5.0)
Alkaline Phosphatase: 50 U/L (ref 38–126)
Anion gap: 10 (ref 5–15)
BUN: 12 mg/dL (ref 6–20)
CO2: 26 mmol/L (ref 22–32)
Calcium: 9.7 mg/dL (ref 8.9–10.3)
Chloride: 101 mmol/L (ref 98–111)
Creatinine, Ser: 0.66 mg/dL (ref 0.44–1.00)
GFR, Estimated: >60 mL/min (ref >60)
Glucose, Bld: 82 mg/dL (ref 70–99)
Potassium: 3.6 mmol/L (ref 3.5–5.1)
Sodium: 137 mmol/L (ref 135–145)
Total Bilirubin: 0.4 mg/dL (ref 0.0–1.2)
Total Protein: 7.4 g/dL (ref 6.5–8.1)

## 2023-05-04 NOTE — Patient Instructions (Signed)
 Tara Lewis  05/04/2023     @PREFPERIOPPHARMACY @   Your procedure is scheduled on  05/09/2023.   Report to [facility] at  1200 P.M.    Call this number if you have problems the morning of surgery:  [phone]  If you experience any cold or flu symptoms such as cough, fever, chills, shortness of breath, etc. between now and your scheduled surgery, please notify us  at the above number.   Remember:  Follow the diet and prep instructions given to you by the office.   You may drink clear liquids until 1000 am on 05/09/2023.    Clear liquids allowed are:                    Water, Juice (No red color; non-citric and without pulp; diabetics please choose diet or no sugar options), Carbonated beverages (diabetics please choose diet or no sugar options), Clear Tea (No creamer, milk, or cream, including half & half and powdered creamer), Black Coffee Only (No creamer, milk or cream, including half & half and powdered creamer), and Clear Sports drink (No red color; diabetics please choose diet or no sugar options)    Take these medicines the morning of surgery with A SIP OF WATER         diazepam (if needed), cymbalta . Use your inhalers before you come and bring your rescue inhaler with you.    Do not wear jewelry, make-up or nail polish, including gel polish,  artificial nails, or any other type of covering on natural nails (fingers and  toes).  Do not wear lotions, powders, or perfumes, or deodorant.  Do not shave 48 hours prior to surgery.  Men may shave face and neck.  Do not bring valuables to the hospital.  [facility] is not responsible for any belongings or valuables.  Contacts, dentures or bridgework may not be worn into surgery.  Leave your suitcase in the car.  After surgery it may be brought to your room.  For patients admitted to the hospital, discharge time will be determined by your treatment team.  Patients discharged the day of surgery will not be allowed  to drive home and must have someone with them for 24 hours.    Special instructions:   DO NOT smoke tobacco or vape for 24 hours before your procedure.  Please read over the following fact sheets that you were given. Anesthesia Post-op Instructions and Care and Recovery After Surgery      Upper Endoscopy, Adult, Care After After the procedure, it is common to have a sore throat. It is also common to have: Mild stomach pain or discomfort. Bloating. Nausea. Follow these instructions at home: The instructions below may help you care for yourself at home. Your health care provider may give you more instructions. If you have questions, ask your health care provider. If you were given a sedative during the procedure, it can affect you for several hours. Do not drive or operate machinery until your health care provider says that it is safe. If you will be going home right after the procedure, plan to have a responsible adult: Take you home from the hospital or clinic. You will not be allowed to drive. Care for you for the time you are told. Follow instructions from your health care provider about what you may eat and drink. Return to your normal activities as told by your health care provider. Ask your health care  provider what activities are safe for you. Take over-the-counter and prescription medicines only as told by your health care provider. Contact a health care provider if you: Have a sore throat that lasts longer than one day. Have trouble swallowing. Have a fever. Get help right away if you: Vomit blood or your vomit looks like coffee grounds. Have bloody, black, or tarry stools. Have a very bad sore throat or you cannot swallow. Have difficulty breathing or very bad pain in your chest or abdomen. These symptoms may be an emergency. Get help right away. Call 911. Do not wait to see if the symptoms will go away. Do not drive yourself to the hospital. Summary After the procedure,  it is common to have a sore throat, mild stomach discomfort, bloating, and nausea. If you were given a sedative during the procedure, it can affect you for several hours. Do not drive until your health care provider says that it is safe. Follow instructions from your health care provider about what you may eat and drink. Return to your normal activities as told by your health care provider. This information is not intended to replace advice given to you by your health care provider. Make sure you discuss any questions you have with your health care provider. Document Revised: 07/27/2021 Document Reviewed: 07/27/2021 Elsevier Patient Education  2024 [employer] Colonoscopy, Adult, Care After The following information offers guidance on how to care for yourself after your procedure. Your health care provider may also give you more specific instructions. If you have problems or questions, contact your health care provider. What can I expect after the procedure? After the procedure, it is common to have: A small amount of blood in your stool for 24 hours after the procedure. Some gas. Mild cramping or bloating of your abdomen. Follow these instructions at home: Eating and drinking  Drink enough fluid to keep your urine pale yellow. Follow instructions from your health care provider about eating or drinking restrictions. Resume your normal diet as told by your health care provider. Avoid heavy or fried foods that are hard to digest. Activity Rest as told by your health care provider. Avoid sitting for a long time without moving. Get up to take short walks every 1-2 hours. This is important to improve blood flow and breathing. Ask for help if you feel weak or unsteady. Return to your normal activities as told by your health care provider. Ask your health care provider what activities are safe for you. Managing cramping and bloating  Try walking around when you have cramps or feel bloated. If  directed, apply heat to your abdomen as told by your health care provider. Use the heat source that your health care provider recommends, such as a moist heat pack or a heating pad. Place a towel between your skin and the heat source. Leave the heat on for 20-30 minutes. Remove the heat if your skin turns bright red. This is especially important if you are unable to feel pain, heat, or cold. You have a greater risk of getting burned. General instructions If you were given a sedative during the procedure, it can affect you for several hours. Do not drive or operate machinery until your health care provider says that it is safe. For the first 24 hours after the procedure: Do not sign important documents. Do not drink alcohol. Do your regular daily activities at a slower pace than normal. Eat soft foods that are easy to digest. Take over-the-counter and prescription  medicines only as told by your health care provider. Keep all follow-up visits. This is important. Contact a health care provider if: You have blood in your stool 2-3 days after the procedure. Get help right away if: You have more than a small spotting of blood in your stool. You have large blood clots in your stool. You have swelling of your abdomen. You have nausea or vomiting. You have a fever. You have increasing pain in your abdomen that is not relieved with medicine. These symptoms may be an emergency. Get help right away. Call 911. Do not wait to see if the symptoms will go away. Do not drive yourself to the hospital. Summary After the procedure, it is common to have a small amount of blood in your stool. You may also have mild cramping and bloating of your abdomen. If you were given a sedative during the procedure, it can affect you for several hours. Do not drive or operate machinery until your health care provider says that it is safe. Get help right away if you have a lot of blood in your stool, nausea or vomiting, a  fever, or increased pain in your abdomen. This information is not intended to replace advice given to you by your health care provider. Make sure you discuss any questions you have with your health care provider. Document Revised: 05/30/2022 Document Reviewed: 12/08/2020 Elsevier Patient Education  2024 [employer]. Monitored Anesthesia Care, Care After The following information offers guidance on how to care for yourself after your procedure. Your health care provider may also give you more specific instructions. If you have problems or questions, contact your health care provider. What can I expect after the procedure? After the procedure, it is common to have: Tiredness. Little or no memory about what happened during or after the procedure. Impaired judgment when it comes to making decisions. Nausea or vomiting. Some trouble with balance. Follow these instructions at home: For the time period you were told by your health care provider:  Rest. Do not participate in activities where you could fall or become injured. Do not drive or use machinery. Do not drink alcohol. Do not take sleeping pills or medicines that cause drowsiness. Do not make important decisions or sign legal documents. Do not take care of children on your own. Medicines Take over-the-counter and prescription medicines only as told by your health care provider. If you were prescribed antibiotics, take them as told by your health care provider. Do not stop using the antibiotic even if you start to feel better. Eating and drinking Follow instructions from your health care provider about what you may eat and drink. Drink enough fluid to keep your urine pale yellow. If you vomit: Drink clear fluids slowly and in small amounts as you are able. Clear fluids include water, ice chips, low-calorie sports drinks, and fruit juice that has water added to it (diluted fruit juice). Eat light and bland foods in small amounts as you  are able. These foods include bananas, applesauce, rice, lean meats, toast, and crackers. General instructions  Have a responsible adult stay with you for the time you are told. It is important to have someone help care for you until you are awake and alert. If you have sleep apnea, surgery and some medicines can increase your risk for breathing problems. Follow instructions from your health care provider about wearing your sleep device: When you are sleeping. This includes during daytime naps. While taking prescription pain medicines, sleeping medicines,  or medicines that make you drowsy. Do not use any products that contain nicotine  or tobacco. These products include cigarettes, chewing tobacco, and vaping devices, such as e-cigarettes. If you need help quitting, ask your health care provider. Contact a health care provider if: You feel nauseous or vomit every time you eat or drink. You feel light-headed. You are still sleepy or having trouble with balance after 24 hours. You get a rash. You have a fever. You have redness or swelling around the IV site. Get help right away if: You have trouble breathing. You have new confusion after you get home. These symptoms may be an emergency. Get help right away. Call 911. Do not wait to see if the symptoms will go away. Do not drive yourself to the hospital. This information is not intended to replace advice given to you by your health care provider. Make sure you discuss any questions you have with your health care provider. Document Revised: 09/12/2021 Document Reviewed: 09/12/2021 Elsevier Patient Education  2024 [employer].

## 2023-05-09 ENCOUNTER — Ambulatory Visit (HOSPITAL_COMMUNITY)
Admission: RE | Admit: 2023-05-09 | Discharge: 2023-05-09 | Disposition: A | Discharge status: Home / Self Care | Payer: Medicaid Other | Attending: Internal Medicine | Admitting: Internal Medicine

## 2023-05-09 ENCOUNTER — Encounter (HOSPITAL_COMMUNITY)
Admission: RE | Disposition: A | Discharge status: Home / Self Care | Payer: Self-pay | Source: Home / Self Care | Attending: Internal Medicine

## 2023-05-09 ENCOUNTER — Ambulatory Visit (HOSPITAL_COMMUNITY): Payer: Medicaid Other | Admitting: Anesthesiology

## 2023-05-09 ENCOUNTER — Other Ambulatory Visit: Payer: Self-pay

## 2023-05-09 ENCOUNTER — Encounter (HOSPITAL_COMMUNITY): Payer: Self-pay | Admitting: Internal Medicine

## 2023-05-09 DIAGNOSIS — K641 Second degree hemorrhoids: Secondary | ICD-10-CM | POA: Diagnosis not present

## 2023-05-09 DIAGNOSIS — F17210 Nicotine dependence, cigarettes, uncomplicated: Secondary | ICD-10-CM | POA: Insufficient documentation

## 2023-05-09 DIAGNOSIS — M797 Fibromyalgia: Secondary | ICD-10-CM | POA: Insufficient documentation

## 2023-05-09 DIAGNOSIS — K219 Gastro-esophageal reflux disease without esophagitis: Secondary | ICD-10-CM | POA: Diagnosis not present

## 2023-05-09 DIAGNOSIS — Z5986 Financial insecurity: Secondary | ICD-10-CM | POA: Insufficient documentation

## 2023-05-09 DIAGNOSIS — R933 Abnormal findings on diagnostic imaging of other parts of digestive tract: Secondary | ICD-10-CM | POA: Diagnosis not present

## 2023-05-09 DIAGNOSIS — K648 Other hemorrhoids: Secondary | ICD-10-CM | POA: Diagnosis not present

## 2023-05-09 DIAGNOSIS — K529 Noninfective gastroenteritis and colitis, unspecified: Secondary | ICD-10-CM | POA: Diagnosis not present

## 2023-05-09 DIAGNOSIS — F17290 Nicotine dependence, other tobacco product, uncomplicated: Secondary | ICD-10-CM | POA: Diagnosis not present

## 2023-05-09 DIAGNOSIS — I10 Essential (primary) hypertension: Secondary | ICD-10-CM | POA: Diagnosis not present

## 2023-05-09 DIAGNOSIS — J449 Chronic obstructive pulmonary disease, unspecified: Secondary | ICD-10-CM | POA: Insufficient documentation

## 2023-05-09 DIAGNOSIS — K766 Portal hypertension: Secondary | ICD-10-CM | POA: Diagnosis not present

## 2023-05-09 DIAGNOSIS — Z79890 Hormone replacement therapy (postmenopausal): Secondary | ICD-10-CM | POA: Diagnosis not present

## 2023-05-09 DIAGNOSIS — R1310 Dysphagia, unspecified: Secondary | ICD-10-CM | POA: Insufficient documentation

## 2023-05-09 DIAGNOSIS — K3189 Other diseases of stomach and duodenum: Secondary | ICD-10-CM | POA: Diagnosis not present

## 2023-05-09 DIAGNOSIS — E039 Hypothyroidism, unspecified: Secondary | ICD-10-CM | POA: Insufficient documentation

## 2023-05-09 DIAGNOSIS — K2950 Unspecified chronic gastritis without bleeding: Secondary | ICD-10-CM

## 2023-05-09 HISTORY — PX: ESOPHAGOGASTRODUODENOSCOPY (EGD) WITH PROPOFOL: SHX5813

## 2023-05-09 HISTORY — PX: BIOPSY: SHX5522

## 2023-05-09 HISTORY — PX: MALONEY DILATION: SHX5535

## 2023-05-09 HISTORY — PX: COLONOSCOPY WITH PROPOFOL: SHX5780

## 2023-05-09 SURGERY — COLONOSCOPY WITH PROPOFOL
Anesthesia: General

## 2023-05-09 MED ORDER — PROPOFOL 10 MG/ML IV BOLUS
INTRAVENOUS | Status: DC | PRN
  Administered 2023-05-09: 40 mg via INTRAVENOUS
  Administered 2023-05-09: 80 mg via INTRAVENOUS

## 2023-05-09 MED ORDER — PROPOFOL 500 MG/50ML IV EMUL
INTRAVENOUS | Status: DC | PRN
  Administered 2023-05-09: 150 ug/kg/min via INTRAVENOUS

## 2023-05-09 MED ORDER — LACTATED RINGERS IV SOLN: INTRAVENOUS | Status: DC | PRN

## 2023-05-09 MED ORDER — LIDOCAINE HCL (CARDIAC) PF 100 MG/5ML IV SOSY
PREFILLED_SYRINGE | INTRAVENOUS | Status: DC | PRN
  Administered 2023-05-09: 100 mg via INTRAVENOUS

## 2023-05-09 MED ORDER — PHENYLEPHRINE 80 MCG/ML (10ML) SYRINGE FOR IV PUSH (FOR BLOOD PRESSURE SUPPORT)
PREFILLED_SYRINGE | INTRAVENOUS | Status: DC | PRN
  Administered 2023-05-09: 160 ug via INTRAVENOUS

## 2023-05-09 MED ORDER — LIDOCAINE HCL (PF) 2 % IJ SOLN
INTRAMUSCULAR | Status: AC
  Filled 2023-05-09: qty 5

## 2023-05-09 MED ORDER — PROPOFOL 1000 MG/100ML IV EMUL
INTRAVENOUS | Status: AC
  Filled 2023-05-09: qty 100

## 2023-05-09 NOTE — Op Note (Signed)
 [facility] Patient Name: Tara Lewis Procedure Date: 05/09/2023 9:03 AM MRN: [id number] Date of Birth: 17-Oct-1965 Attending MD: Lamar Ozell Hollingshead , MD, [id number] CSN: [id number] Age: 58 Admit Type: Outpatient Procedure:                Upper GI endoscopy Indications:              Dysphagia, Abnormal CT of the GI tract (duodenal) Providers:                Lamar Ozell Hollingshead, MD, Jon LABOR. Museum/gallery Exhibitions Officer, CHARITY FUNDRAISER,                            Mickel CROME. Shirlean Balm, Technician Referring MD:              Medicines:                Propofol  per Anesthesia Complications:            No immediate complications. Estimated blood loss:                            Minimal. Estimated Blood Loss:     Estimated blood loss was minimal. Procedure:                Pre-Anesthesia Assessment:                           - Prior to the procedure, a History and Physical                            was performed, and patient medications and                            allergies were reviewed. The patient's tolerance of                            previous anesthesia was also reviewed. The risks                            and benefits of the procedure and the sedation                            options and risks were discussed with the patient.                            All questions were answered, and informed consent                            was obtained. Prior Anticoagulants: The patient has                            taken no anticoagulant or antiplatelet agents. ASA                            Grade Assessment: III - A patient with severe  systemic disease. After reviewing the risks and                            benefits, the patient was deemed in satisfactory                            condition to undergo the procedure.                           After obtaining informed consent, the endoscope was                            passed under direct vision. Throughout the                             procedure, the patient's blood pressure, pulse, and                            oxygen saturations were monitored continuously. The                            GIF-H190 ([id number]) scope was introduced through the                            mouth, and advanced to the second part of duodenum.                            The upper GI endoscopy was accomplished without                            difficulty. The patient tolerated the procedure                            well. Scope In: 9:26:56 AM Scope Out: 9:34:47 AM Total Procedure Duration: 0 hours 7 minutes 51 seconds  Findings:      The examined esophagus was normal. Gastric mucosa mild abnormal changes       consistent with portal hypertensive gastropathy particularly in the       fundus cardia and body no ulcer or infiltrating process seen. Patent       pylorus. The scope was withdrawn. Dilation was performed with a Maloney       dilator with mild resistance at 54 Fr. The dilation site was examined       following endoscope reinsertion and showed no change. Estimated blood       loss: none. Finally gastric mucosa biopsied for histologic study.      The duodenal bulb and second portion of the duodenum were normal. Impression:               - Normal esophagus. Dilated.                           -Abnormal gastric mucosa consistent with portal                            hypertensive gastropathy status biopsy  following                            dilation.                           - Normal duodenal bulb and second portion of the                            duodenum. Moderate Sedation:      Moderate (conscious) sedation was personally administered by an       anesthesia professional. The following parameters were monitored: oxygen       saturation, heart rate, blood pressure, respiratory rate, EKG, adequacy       of pulmonary ventilation, and response to care. Recommendation:           - Patient has a contact number available  for                            emergencies. The signs and symptoms of potential                            delayed complications were discussed with the                            patient. Return to normal activities tomorrow.                            Written discharge instructions were provided to the                            patient.                           - Advance diet as tolerated.                           - Continue present medications.                           - Return to my office in 5 weeks for further                            evaluation of potential underlying liver. See                            colonoscopy report. Follow-up on pathology. Procedure Code(s):        --- Professional ---                           [phone], Esophagogastroduodenoscopy, flexible,                            transoral; diagnostic, including collection of                            specimen(s) by brushing or washing, when performed                            (  separate procedure)                           43450, Dilation of esophagus, by unguided sound or                            bougie, single or multiple passes Diagnosis Code(s):        --- Professional ---                           R13.10, Dysphagia, unspecified                           R93.3, Abnormal findings on diagnostic imaging of                            other parts of digestive tract CPT copyright 2022 [employer]. All rights reserved. The codes documented in this report are preliminary and upon coder review may  be revised to meet current compliance requirements. Lamar HERO. Quention Mcneill, MD Lamar Ozell Hollingshead, MD 05/09/2023 10:01:23 AM This report has been signed electronically. Number of Addenda: 0

## 2023-05-09 NOTE — H&P (Addendum)
 @LOGO @   Primary Care Physician:  Rosan Jacquline NOVAK, NP Primary Gastroenterologist:  Dr. Shaaron  Pre-Procedure History & Physical: HPI:  Tara Lewis is a 58 y.o. female here for  further evaluation of abnormal duodenum and colon a suggesting enterocolitis.  Pancreas appeared normal.  Lipase mildly elevated.  History of alcohol use.  Reportedly, C. difficile  labs were discordant.  She did receive a course of vancomycin. Past Medical History:  Diagnosis Date   Anxiety    Carpal tunnel syndrome    COPD (chronic obstructive pulmonary disease) (HCC)    Depression    Fibromyalgia    GERD (gastroesophageal reflux disease)    Graves' disease    Hypothyroidism     Past Surgical History:  Procedure Laterality Date   ABDOMINAL HYSTERECTOMY  2000s   KNEE SURGERY Right    OVARIAN CYST SURGERY      Prior to Admission medications   Medication Sig Start Date End Date Taking? Authorizing Provider  albuterol  (VENTOLIN  HFA) 108 (90 Base) MCG/ACT inhaler Inhale 2 puffs into the lungs every 6 (six) hours as needed for wheezing or shortness of breath. 07/21/20  Yes Comer Kirsch, PA-C  allopurinol (ZYLOPRIM) 300 MG tablet Take 300 mg by mouth daily.   Yes [provider]  B Complex Vitamins (B COMPLEX-B12) TABS Take by mouth.   Yes [provider]  budesonide -formoterol  (SYMBICORT ) 80-4.5 MCG/ACT inhaler  10/06/21  Yes [provider]  celecoxib (CELEBREX) 100 MG capsule Take 100 mg by mouth 2 (two) times daily.   Yes [provider]  cyanocobalamin (VITAMIN B12) 1000 MCG tablet Take 1 tablet by mouth daily. 03/15/23 03/14/24 Yes [provider]  diazepam (VALIUM) 2 MG tablet Take 2 mg by mouth every 6 (six) hours as needed for anxiety.   Yes [provider]  DULoxetine  (CYMBALTA ) 30 MG capsule Take 1 capsule (30 mg total) by mouth daily. 02/06/23  Yes Patel, Donika K, DO  folic acid (FOLVITE) 1 MG tablet Take 1 mg by mouth daily.   Yes  [provider]  levothyroxine  (SYNTHROID ) 75 MCG tablet Take 50 mcg by mouth daily before breakfast. Patient reports she takes 50 mcg daily not 75 mcg   Yes [provider]  SYMBICORT  80-4.5 MCG/ACT inhaler SMARTSIG:2 Puff(s) By Mouth Twice Daily 11/24/21  Yes [provider]  colchicine 0.6 MG tablet  10/06/22   [provider]  dicyclomine (BENTYL) 10 MG capsule Take 10 mg by mouth daily. 10/06/21   [provider]  levothyroxine  (SYNTHROID ) 88 MCG tablet TAKE 1 TABLET BY MOUTH ONCE DAILY BEFORE BREAKFAST Patient not taking: Reported on 04/06/2023 05/26/22   [name], NP  methocarbamol (ROBAXIN) 500 MG tablet Take 1,000 mg by mouth 3 (three) times daily. Patient not taking: Reported on 04/06/2023 02/09/23   [provider]  ondansetron (ZOFRAN-ODT) 4 MG disintegrating tablet SMARTSIG:1 Tablet(s) By Mouth Every 12 Hours PRN Patient not taking: Reported on 04/06/2023 02/09/23   [provider]    Allergies as of 04/06/2023 - Review Complete 04/06/2023  Allergen Reaction Noted   Codeine Other (See Comments) 05/11/2016   Statins Other (See Comments) 05/17/2017    Family History  Problem Relation Age of Onset   Kidney disease Mother    Thyroid  disease Mother    Fibromyalgia Mother    Asthma Mother    Hypertension Father    Heart attack Father        3 heart attacks   Heart disease  Father    Thyroid  disease Sister    Fibromyalgia Sister     Social History   Socioeconomic History   Marital status: Widowed    Spouse name: Not on file   Number of children: Not on file   Years of education: Not on file   Highest education level: Not on file  Occupational History   Not on file  Tobacco Use   Smoking status: Every Day    Current packs/day: 0.50    Average packs/day: 0.5 packs/day for 34.0 years (17.0 ttl pk-yrs)    Types: Cigarettes   Smokeless tobacco: Never  Vaping Use   Vaping status: Some Days   Substances:  Nicotine   Substance and Sexual Activity   Alcohol use: Not Currently    Comment: 3-4 cans of beer/daily   Drug use: No   Sexual activity: Never    Birth control/protection: Surgical    Comment: last time approx october 2017  Other Topics Concern   Not on file  Social History Narrative   Are you right handed or left handed? Right Handed    Are you currently employed ? Yes   What is your current occupation? [employer]    Do you live at home alone? Yes   Who lives with you? Boyfriend stays with her sometimes    What type of home do you live in: 1 story or 2 story? Lives in a one story home        Social Drivers of Health   Financial Resource Strain: Medium Risk (04/28/2023)   Received from [facility]   Overall Financial Resource Strain (CARDIA)    Difficulty of Paying Living Expenses: Somewhat hard  Food Insecurity: No Food Insecurity (04/28/2023)   Received from [facility]   Hunger Vital Sign    Worried About Running Out of Food in the Last Year: Never true    Ran Out of Food in the Last Year: Never true  Transportation Needs: No Transportation Needs (04/28/2023)   Received from [facility] - Transportation    Lack of Transportation (Medical): No    Lack of Transportation (Non-Medical): No  Physical Activity: Unknown (04/28/2023)   Received from [facility]   Exercise Vital Sign    Days of Exercise per Week: 0 days    Minutes of Exercise per Session: Not on file  Stress: Stress Concern Present (04/28/2023)   Received from [facility] - Occupational Stress Questionnaire    Feeling of Stress : Very much  Social Connections: Somewhat Isolated (04/28/2023)   Received from [facility]   Social Network    How would you rate your social network (family, work, friends)?: Restricted participation with some degree of social isolation  Intimate Partner Violence: Not At Risk (04/28/2023)   Received from [facility]   HITS    Over the last 12 months how often did your partner physically hurt you?: Never    Over the last 12 months how often did your partner insult you or talk down to you?: Never    Over the last 12 months how often did your partner threaten you with physical harm?: Never    Over the last 12 months how often did your partner scream or curse at you?: Never    Review of Systems: See HPI, otherwise negative ROS  Physical Exam: BP 107/78   Pulse 78   Temp 98.1 F (36.7 C) (Oral)  Resp 10   Ht 5' 6 (1.676 m)   Wt 61.2 kg   SpO2 97%   BMI 21.79 kg/m  General:   Alert,  Well-developed, well-nourished, pleasant and cooperative in NAD Skin:  Intact without significant lesions or rashes. Eyes:  Sclera clear, no icterus.   Conjunctiva pink. Ears:  Normal auditory acuity. Nose:  No deformity, discharge,  or lesions. Mouth:  No deformity or lesions. Neck:  Supple; no masses or thyromegaly. No significant cervical adenopathy. Lungs:  Clear throughout to auscultation.   No wheezes, crackles, or rhonchi. No acute distress. Heart:  Regular rate and rhythm; no murmurs, clicks, rubs,  or gallops. Abdomen: Non-distended, normal bowel sounds.  Soft and nontender without appreciable mass or hepatosplenomegaly.  Pulses:  Normal pulses noted. Extremities:  Without clubbing or edema.  Impression/Plan:    58 year old lady admitted to the hospital recently with enterocolitis she has recovered she was treated for C. difficile bowel function back to normal has intermittent esophageal dysphagia.  EGD with possible esophageal dilation is feasible/appropriate today and first-ever colonoscopy per plan today. The risks, benefits, limitations, alternatives and imponderables have been reviewed with the patient. Questions have been answered. All parties are agreeable.  The risks, benefits, limitations, alternatives and imponderables have been reviewed with the patient. Potential for esophageal dilation,  biopsy, etc. have also been reviewed.  Questions have been answered. All parties agreeable.      Notice: This dictation was prepared with Dragon dictation along with smaller phrase technology. Any transcriptional errors that result from this process are unintentional and may not be corrected upon review.

## 2023-05-09 NOTE — Discharge Instructions (Signed)
 EGD Discharge instructions Please read the instructions outlined below and refer to this sheet in the next few weeks. These discharge instructions provide you with general information on caring for yourself after you leave the hospital. Your doctor may also give you specific instructions. While your treatment has been planned according to the most current medical practices available, unavoidable complications occasionally occur. If you have any problems or questions after discharge, please call your doctor. ACTIVITY You may resume your regular activity but move at a slower pace for the next 24 hours.  Take frequent rest periods for the next 24 hours.  Walking will help expel (get rid of) the air and reduce the bloated feeling in your abdomen.  No driving for 24 hours (because of the anesthesia (medicine) used during the test).  You may shower.  Do not sign any important legal documents or operate any machinery for 24 hours (because of the anesthesia used during the test).  NUTRITION Drink plenty of fluids.  You may resume your normal diet.  Begin with a light meal and progress to your normal diet.  Avoid alcoholic beverages for 24 hours or as instructed by your caregiver.  MEDICATIONS You may resume your normal medications unless your caregiver tells you otherwise.  WHAT YOU CAN EXPECT TODAY You may experience abdominal discomfort such as a feeling of fullness or "gas" pains.  FOLLOW-UP Your doctor will discuss the results of your test with you.  SEEK IMMEDIATE MEDICAL ATTENTION IF ANY OF THE FOLLOWING OCCUR: Excessive nausea (feeling sick to your stomach) and/or vomiting.  Severe abdominal pain and distention (swelling).  Trouble swallowing.  Temperature over 101 F (37.8 C).  Rectal bleeding or vomiting of blood.    Colonoscopy Discharge Instructions  Read the instructions outlined below and refer to this sheet in the next few weeks. These discharge instructions provide you with  general information on caring for yourself after you leave the hospital. Your doctor may also give you specific instructions. While your treatment has been planned according to the most current medical practices available, unavoidable complications occasionally occur. If you have any problems or questions after discharge, call Dr. Shaaron at [phone]. ACTIVITY You may resume your regular activity, but move at a slower pace for the next 24 hours.  Take frequent rest periods for the next 24 hours.  Walking will help get rid of the air and reduce the bloated feeling in your belly (abdomen).  No driving for 24 hours (because of the medicine (anesthesia) used during the test).   Do not sign any important legal documents or operate any machinery for 24 hours (because of the anesthesia used during the test).  NUTRITION Drink plenty of fluids.  You may resume your normal diet as instructed by your doctor.  Begin with a light meal and progress to your normal diet. Heavy or fried foods are harder to digest and may make you feel sick to your stomach (nauseated).  Avoid alcoholic beverages for 24 hours or as instructed.  MEDICATIONS You may resume your normal medications unless your doctor tells you otherwise.  WHAT YOU CAN EXPECT TODAY Some feelings of bloating in the abdomen.  Passage of more gas than usual.  Spotting of blood in your stool or on the toilet paper.  IF YOU HAD POLYPS REMOVED DURING THE COLONOSCOPY: No aspirin products for 7 days or as instructed.  No alcohol for 7 days or as instructed.  Eat a soft diet for the next 24 hours.  FINDING  OUT THE RESULTS OF YOUR TEST Not all test results are available during your visit. If your test results are not back during the visit, make an appointment with your caregiver to find out the results. Do not assume everything is normal if you have not heard from your caregiver or the medical facility. It is important for you to follow up on all of your test  results.  SEEK IMMEDIATE MEDICAL ATTENTION IF: You have more than a spotting of blood in your stool.  Your belly is swollen (abdominal distention).  You are nauseated or vomiting.  You have a temperature over 101.  You have abdominal pain or discomfort that is severe or gets worse throughout the day.       Your esophagus was stretched today.  Your stomach was slightly inflamed.  Biopsies taken.      Your colon and small intestine appeared normal.  It is recommended you return in 10 years for screening colonoscopy  Office visit with Charmaine Melia in about 5 weeks.  Further recommendations to follow pending review of pathology report  At patient request, I called Franky Kansky at [phone]

## 2023-05-09 NOTE — Op Note (Signed)
 [facility] Patient Name: Tara Lewis Procedure Date: 05/09/2023 9:35 AM MRN: [id number] Date of Birth: 12-31-1965 Attending MD: Lamar Ozell Hollingshead , MD, [id number] CSN: [id number] Age: 58 Admit Type: Outpatient Procedure:                Colonoscopy Indications:              Abnormal CT of the GI tract Providers:                Lamar Ozell Hollingshead, MD, Jon LABOR. Gerome RN, RN,                            Crystal Page Referring MD:              Medicines:                Propofol  per Anesthesia Complications:            No immediate complications. Estimated Blood Loss:     Estimated blood loss: none. Procedure:                Pre-Anesthesia Assessment:                           - Prior to the procedure, a History and Physical                            was performed, and patient medications and                            allergies were reviewed. The patient's tolerance of                            previous anesthesia was also reviewed. The risks                            and benefits of the procedure and the sedation                            options and risks were discussed with the patient.                            All questions were answered, and informed consent                            was obtained. Prior Anticoagulants: The patient has                            taken no anticoagulant or antiplatelet agents. ASA                            Grade Assessment: III - A patient with severe                            systemic disease. After reviewing the risks and  benefits, the patient was deemed in satisfactory                            condition to undergo the procedure.                           After obtaining informed consent, the colonoscope                            was passed under direct vision. Throughout the                            procedure, the patient's blood pressure, pulse, and                            oxygen  saturations were monitored continuously. The                            [phone]) scope was introduced through the                            anus and advanced to the 10 cm into the ileum. The                            colonoscopy was performed without difficulty. The                            patient tolerated the procedure well. The quality                            of the bowel preparation was adequate. The terminal                            ileum, ileocecal valve, appendiceal orifice, and                            rectum were photographed. Scope In: 9:40:56 AM Scope Out: 9:54:28 AM Scope Withdrawal Time: 0 hours 6 minutes 21 seconds  Total Procedure Duration: 0 hours 13 minutes 32 seconds  Findings:      The perianal and digital rectal examinations were normal.      Non-bleeding internal hemorrhoids were found during retroflexion. The       hemorrhoids were moderate, medium-sized and Grade II (internal       hemorrhoids that prolapse but reduce spontaneously).      The exam was otherwise without abnormality on direct and retroflexion       views. Distal 10 cm of terminal ileum appeared normal. Impression:               - Non-bleeding internal hemorrhoids.                           - The examination was otherwise normal on direct  and retroflexion views. Normal-appearing terminal                            ileum.                           - No specimens collected. I suspect recent CT                            findings were reflective of an infectious                            enterocolitis which has resolved. Moderate Sedation:      Moderate (conscious) sedation was personally administered by an       anesthesia professional. The following parameters were monitored: oxygen       saturation, heart rate, blood pressure, respiratory rate, EKG, adequacy       of pulmonary ventilation, and response to care. Recommendation:           - Repeat  colonoscopy in 10 years for screening                            purposes.                           - Return to GI office in 5 weeks. See EGD report. Procedure Code(s):        --- Professional ---                           [phone], Colonoscopy, flexible; diagnostic, including                            collection of specimen(s) by brushing or washing,                            when performed (separate procedure) Diagnosis Code(s):        --- Professional ---                           K64.1, Second degree hemorrhoids                           R93.3, Abnormal findings on diagnostic imaging of                            other parts of digestive tract CPT copyright 2022 American Medical Association. All rights reserved. The codes documented in this report are preliminary and upon coder review may  be revised to meet current compliance requirements. Lamar HERO. Myreon Wimer, MD Lamar Ozell Hollingshead, MD 05/09/2023 10:04:23 AM This report has been signed electronically. Number of Addenda: 0

## 2023-05-09 NOTE — Anesthesia Preprocedure Evaluation (Signed)
 Anesthesia Evaluation  Patient identified by MRN, date of birth, ID band Patient awake    Reviewed: Allergy & Precautions, H&P , NPO status , Patient's Chart, lab work & pertinent test results, reviewed documented beta blocker date and time   Airway Mallampati: II  TM Distance: >3 FB Neck ROM: full    Dental no notable dental hx.    Pulmonary COPD, Current Smoker and Patient abstained from smoking.   Pulmonary exam normal breath sounds clear to auscultation       Cardiovascular Exercise Tolerance: Good hypertension, negative cardio ROS  Rhythm:regular Rate:Normal     Neuro/Psych  PSYCHIATRIC DISORDERS Anxiety Depression     Neuromuscular disease    GI/Hepatic Neg liver ROS,GERD  ,,  Endo/Other  Hypothyroidism    Renal/GU negative Renal ROS  negative genitourinary   Musculoskeletal   Abdominal   Peds  Hematology negative hematology ROS (+)   Anesthesia Other Findings   Reproductive/Obstetrics negative OB ROS                             Anesthesia Physical Anesthesia Plan  ASA: 2  Anesthesia Plan: General   Post-op Pain Management:    Induction:   PONV Risk Score and Plan: Propofol  infusion  Airway Management Planned:   Additional Equipment:   Intra-op Plan:   Post-operative Plan:   Informed Consent: I have reviewed the patients History and Physical, chart, labs and discussed the procedure including the risks, benefits and alternatives for the proposed anesthesia with the patient or authorized representative who has indicated his/her understanding and acceptance.     Dental Advisory Given  Plan Discussed with: CRNA  Anesthesia Plan Comments:        Anesthesia Quick Evaluation

## 2023-05-09 NOTE — Transfer of Care (Addendum)
 Immediate Anesthesia Transfer of Care Note  Patient: Tara Lewis  Procedure(s) Performed: COLONOSCOPY WITH PROPOFOL  ESOPHAGOGASTRODUODENOSCOPY (EGD) WITH PROPOFOL  MALONEY DILATION BIOPSY  Patient Location: Short Stay  Anesthesia Type:General  Level of Consciousness: awake and patient cooperative  Airway & Oxygen Therapy: Patient Spontanous Breathing  Post-op Assessment: Report given to RN and Post -op Vital signs reviewed and stable  Post vital signs: Reviewed and stable  Last Vitals:  Vitals Value Taken Time  BP 106/67 05/08/22   1002  Temp 36.8 05/08/22   1002  Pulse 67 05/08/22   1002  Resp 13 05/08/22   1002  SpO2 100% 05/08/22   1002    Last Pain:  Vitals:   05/09/23 0925  TempSrc:   PainSc: 3       Patients Stated Pain Goal: 6 (05/09/23 0739)  Complications: No notable events documented.

## 2023-05-11 ENCOUNTER — Encounter (HOSPITAL_COMMUNITY): Payer: Self-pay | Admitting: Internal Medicine

## 2023-05-11 ENCOUNTER — Encounter: Payer: Self-pay | Admitting: Internal Medicine

## 2023-05-11 ENCOUNTER — Telehealth: Payer: Self-pay | Admitting: *Deleted

## 2023-05-11 LAB — SURGICAL PATHOLOGY

## 2023-05-11 NOTE — Anesthesia Postprocedure Evaluation (Signed)
 Anesthesia Post Note  Patient: Tara Lewis  Procedure(s) Performed: COLONOSCOPY WITH PROPOFOL  ESOPHAGOGASTRODUODENOSCOPY (EGD) WITH PROPOFOL  MALONEY DILATION BIOPSY  Patient location during evaluation: Phase II Anesthesia Type: General Level of consciousness: awake Pain management: pain level controlled Vital Signs Assessment: post-procedure vital signs reviewed and stable Respiratory status: spontaneous breathing and respiratory function stable Cardiovascular status: blood pressure returned to baseline and stable Postop Assessment: no headache and no apparent nausea or vomiting Anesthetic complications: no Comments: Late entry   No notable events documented.   Last Vitals:  Vitals:   05/09/23 0739 05/09/23 1002  BP: 107/78 106/67  Pulse: 78 67  Resp: 10 13  Temp: 36.7 C 36.8 C  SpO2: 97% 100%    Last Pain:  Vitals:   05/10/23 1200  TempSrc:   PainSc: 0-No pain                 [name]

## 2023-05-11 NOTE — Telephone Encounter (Signed)
 error

## 2023-05-11 NOTE — Telephone Encounter (Signed)
 Pt called and would like to know the results of her procedures.

## 2023-05-21 ENCOUNTER — Telehealth: Payer: Self-pay

## 2023-05-21 NOTE — Telephone Encounter (Signed)
The result letter for this patient appears to be incomplete. Pt is calling wanting to know her results.

## 2023-05-21 NOTE — Telephone Encounter (Signed)
Spoke to pt, informed her that a letter would be mailed to her this week with results. Pt voiced understanding.

## 2023-05-22 NOTE — Telephone Encounter (Signed)
Pt was made aware and letter was mailed out

## 2023-05-23 ENCOUNTER — Telehealth: Payer: Self-pay | Admitting: Internal Medicine

## 2023-05-23 NOTE — Telephone Encounter (Signed)
Per Dr. Jenene Slicker.  Event monitor showed 1 run of NSVT and 1 run of SVT, both brief and non-specific. Symptoms correlated with NSR (89 to 99 bpm). PAC and PVC are <1%. Overall, normal event monitor.   Left detailed message per DPR and copy sent to PCP.

## 2023-05-23 NOTE — Telephone Encounter (Signed)
Please call pt in reference to monitor results

## 2023-06-09 ENCOUNTER — Other Ambulatory Visit: Payer: Self-pay | Admitting: Neurology

## 2023-06-12 ENCOUNTER — Ambulatory Visit: Payer: Medicaid Other | Admitting: Internal Medicine

## 2023-06-12 ENCOUNTER — Encounter: Payer: Self-pay | Admitting: *Deleted

## 2023-06-12 ENCOUNTER — Ambulatory Visit: Payer: Medicaid Other | Admitting: Neurology

## 2023-06-12 ENCOUNTER — Encounter: Payer: Self-pay | Admitting: Internal Medicine

## 2023-06-12 ENCOUNTER — Other Ambulatory Visit: Payer: Self-pay

## 2023-06-12 VITALS — BP 110/71 | HR 94 | Temp 97.7°F | Ht 66.0 in | Wt 145.8 lb

## 2023-06-12 DIAGNOSIS — K3189 Other diseases of stomach and duodenum: Secondary | ICD-10-CM

## 2023-06-12 DIAGNOSIS — F1011 Alcohol abuse, in remission: Secondary | ICD-10-CM | POA: Diagnosis not present

## 2023-06-12 DIAGNOSIS — K7460 Unspecified cirrhosis of liver: Secondary | ICD-10-CM

## 2023-06-12 DIAGNOSIS — R933 Abnormal findings on diagnostic imaging of other parts of digestive tract: Secondary | ICD-10-CM

## 2023-06-12 NOTE — Progress Notes (Unsigned)
Primary Care Physician:  Orlene Plum, NP Primary Gastroenterologist:  Dr. Jena Gauss  Pre-Procedure History & Physical: HPI:  Tara Lewis is a 58 y.o. female herer for follow-up related to hospitalization at [facility] for enterocolitis felt to be due to C. difficile last fall.  She recovered with treatment with vancomycin.  CT at that time demonstrated marked inflammation of the duodenum and colon and some inflammation around the pancreas although this was felt to be bystander reaction from enterocolitis. Follow-up evaluation demonstrated a normal fecal calprotectin and normal stool elastase.  Patient has been a heavy drinker for many years (2 DWIs).  Parrott coconut rum - half a gallon or more weekly for years -  intermittently with periods of relative sobriety.  Last alcohol was November 4.  She has been sober thus far.  EGD and colonoscopy on January 8 yielded a normal esophagus -  empiric dilation due to dysphagia, gastric mucosa consistent with portal hypertensive gastropathy.  Biopsies revealed mild gastritis no H. pylori.  Colonoscopy was normal (a 10-year screening interval recommended).  Reports bowel function back to normal having 1-2 formed bowel movements daily no melena no rectal bleeding.  She has gained about 18 pounds since seen in the office previously.  Well but heavy on the sweets in the evening.  History of Graves' disease and radiation to the thyroid.  Now being followed closely by Dr. [facility].  Past Medical History:  Diagnosis Date   Anxiety    Carpal tunnel syndrome    COPD (chronic obstructive pulmonary disease) (HCC)    Depression    Fibromyalgia    GERD (gastroesophageal reflux disease)    Graves' disease    Hypothyroidism     Past Surgical History:  Procedure Laterality Date   ABDOMINAL HYSTERECTOMY  2000s   BIOPSY  05/09/2023   Procedure: BIOPSY;  Surgeon: Corbin Ade, MD;  [facility]: [facility];  Service: Endoscopy;;    COLONOSCOPY WITH PROPOFOL N/A 05/09/2023   Procedure: COLONOSCOPY WITH PROPOFOL;  Surgeon: Corbin Ade, MD;  [facility]: [facility];  Service: Endoscopy;  Laterality: N/A;  200pm, asa 4   ESOPHAGOGASTRODUODENOSCOPY (EGD) WITH PROPOFOL N/A 05/09/2023   Procedure: ESOPHAGOGASTRODUODENOSCOPY (EGD) WITH PROPOFOL;  Surgeon: Corbin Ade, MD;  [facility]: [facility];  Service: Endoscopy;  Laterality: N/A;   KNEE SURGERY Right    MALONEY DILATION  05/09/2023   Procedure: MALONEY DILATION;  Surgeon: Corbin Ade, MD;  Location: [facility];  Service: Endoscopy;;   OVARIAN CYST SURGERY      Prior to Admission medications   Medication Sig Start Date End Date Taking? Authorizing Provider  albuterol (VENTOLIN HFA) 108 (90 Base) MCG/ACT inhaler Inhale 2 puffs into the lungs every 6 (six) hours as needed for wheezing or shortness of breath. 07/21/20  Yes Jacquelin Hawking, PA-C  allopurinol (ZYLOPRIM) 300 MG tablet Take 300 mg by mouth daily.   Yes [provider]  B Complex Vitamins (B COMPLEX-B12) TABS Take by mouth.   Yes [provider]  budesonide-formoterol (SYMBICORT) 80-4.5 MCG/ACT inhaler  10/06/21  Yes [provider]  celecoxib (CELEBREX) 100 MG capsule Take 100 mg by mouth 2 (two) times daily.   Yes [provider]  colchicine 0.6 MG tablet  10/06/22  Yes [provider]  cyanocobalamin (VITAMIN B12) 1000 MCG tablet Take 1 tablet by mouth daily. 03/15/23 03/14/24 Yes [provider]  diazepam (VALIUM) 2 MG tablet Take 2 mg by mouth every  6 (six) hours as needed for anxiety.   Yes [provider]  dicyclomine (BENTYL) 10 MG capsule Take 10 mg by mouth daily. 10/06/21  Yes [provider]  DULoxetine (CYMBALTA) 30 MG capsule Take 1 capsule (30 mg total) by mouth daily. 02/06/23  Yes Patel, Donika K, DO  folic acid (FOLVITE) 1 MG tablet Take 1 mg by mouth daily.   Yes [provider]  levothyroxine (SYNTHROID) 50 MCG  tablet Take 50 mcg by mouth daily before breakfast. Patient reports she takes 50 mcg daily not 75 mcg   Yes [provider]  SYMBICORT 80-4.5 MCG/ACT inhaler SMARTSIG:2 Puff(s) By Mouth Twice Daily 11/24/21  Yes [provider]    Allergies as of 06/12/2023 - Review Complete 06/12/2023  Allergen Reaction Noted   Codeine Other (See Comments) 05/11/2016   Statins Other (See Comments) 05/17/2017    Family History  Problem Relation Age of Onset   Kidney disease Mother    Thyroid disease Mother    Fibromyalgia Mother    Asthma Mother    Hypertension Father    Heart attack Father        3 heart attacks   Heart disease Father    Thyroid disease Sister    Fibromyalgia Sister     Social History   Socioeconomic History   Marital status: Widowed    Spouse name: Not on file   Number of children: Not on file   Years of education: Not on file   Highest education level: Not on file  Occupational History   Not on file  Tobacco Use   Smoking status: Every Day    Current packs/day: 0.50    Average packs/day: 0.5 packs/day for 34.0 years (17.0 ttl pk-yrs)    Types: Cigarettes   Smokeless tobacco: Never  Vaping Use   Vaping status: Some Days   Substances: Nicotine  Substance and Sexual Activity   Alcohol use: Not Currently    Comment: 3-4 cans of beer/daily   Drug use: No   Sexual activity: Never    Birth control/protection: Surgical    Comment: last time approx october 2017  Other Topics Concern   Not on file  Social History Narrative   Are you right handed or left handed? Right Handed    Are you currently employed ? Yes   What is your current occupation? [employer]    Do you live at home alone? Yes   Who lives with you? Boyfriend stays with her sometimes    What type of home do you live in: 1 story or 2 story? Lives in a one story home        Social Drivers of Health   Financial Resource Strain: Medium Risk (04/28/2023)   Received from [facility]    Overall Financial Resource Strain (CARDIA)    Difficulty of Paying Living Expenses: Somewhat hard  Food Insecurity: No Food Insecurity (04/28/2023)   Received from [facility]   Hunger Vital Sign    Worried About Running Out of Food in the Last Year: Never true    Ran Out of Food in the Last Year: Never true  Transportation Needs: No Transportation Needs (04/28/2023)   Received from [facility] - Transportation    Lack of Transportation (Medical): No    Lack of Transportation (Non-Medical): No  Physical Activity: Unknown (04/28/2023)   Received from [facility]   Exercise Vital Sign    Days of Exercise per Week:  0 days    Minutes of Exercise per Session: Not on file  Stress: Stress Concern Present (04/28/2023)   Received from [facility] - Occupational Stress Questionnaire    Feeling of Stress : Very much  Social Connections: Somewhat Isolated (04/28/2023)   Received from [facility]   Social Network    How would you rate your social network (family, work, friends)?: Restricted participation with some degree of social isolation  Intimate Partner Violence: Not At Risk (04/28/2023)   Received from [facility]   HITS    Over the last 12 months how often did your partner physically hurt you?: Never    Over the last 12 months how often did your partner insult you or talk down to you?: Never    Over the last 12 months how often did your partner threaten you with physical harm?: Never    Over the last 12 months how often did your partner scream or curse at you?: Never    Review of Systems: See HPI, otherwise negative ROS  Physical Exam: BP 110/71 (BP Location: Right Arm, Patient Position: Sitting, Cuff Size: Normal)   Pulse 94   Temp 97.7 F (36.5 C) (Oral)   Ht 5\' 6"  (1.676 m)   Wt 145 lb 12.8 oz (66.1 kg)   BMI 23.53 kg/m  General:   Alert,  Well-developed, well-nourished, pleasant and cooperative in NAD.   Accompanied by her significant other. Lungs:  Clear throughout to auscultation.   No wheezes, crackles, or rhonchi. No acute distress. Heart:  Regular rate and rhythm; no murmurs, clicks, rubs,  or gallops. Abdomen: Non-distended, normal bowel sounds.  Soft and nontender without appreciable mass or hepatosplenomegaly.   Impression/Plan:   58 year old lady with alcohol use disorder admitted to [facility] last fall with enterocolitis felt to be secondary to C. difficile.  She was treated for C. difficile and her symptoms resolved.  Inflammation about the pancreas and duodenum likely reactive from infectious colitis.  Nothing to suggest primary pancreatitis  She has been abstinent since November 4.  She was commended.  Recent EGD and colonoscopy as outlined.  Portal gastropathy apparent on EGD.    Recommendations:  Ultrasound with elastography.  C-Met, CBC, INR and AFP  Further recommendations to follow once I have results ready for review  Office visit with [place] in 3 months    Notice: This dictation was prepared with Dragon dictation along with smaller phrase technology. Any transcriptional errors that result from this process are unintentional and may not be corrected upon review.

## 2023-06-12 NOTE — Patient Instructions (Signed)
It was good to see you again today!  Congratulations on sobriety.  This needs to be a lifelong commitment  Ultrasound with elastography.  C-Met, CBC, INR and AFP  Further recommendations to follow once I have results ready for review  Office visit with [place] in 3 months

## 2023-06-13 LAB — COMPREHENSIVE METABOLIC PANEL
ALT: 16 [IU]/L (ref 0–32)
AST: 20 [IU]/L (ref 0–40)
Albumin: 4 g/dL (ref 3.8–4.9)
Alkaline Phosphatase: 75 [IU]/L (ref 44–121)
BUN/Creatinine Ratio: 29 — ABNORMAL HIGH (ref 9–23)
BUN: 22 mg/dL (ref 6–24)
Bilirubin Total: <0.2 mg/dL (ref 0.0–1.2)
CO2: 23 mmol/L (ref 20–29)
Calcium: 9.8 mg/dL (ref 8.7–10.2)
Chloride: 103 mmol/L (ref 96–106)
Creatinine, Ser: 0.77 mg/dL (ref 0.57–1.00)
Globulin, Total: 2.7 g/dL (ref 1.5–4.5)
Glucose: 101 mg/dL — ABNORMAL HIGH (ref 70–99)
Potassium: 4 mmol/L (ref 3.5–5.2)
Sodium: 142 mmol/L (ref 134–144)
Total Protein: 6.7 g/dL (ref 6.0–8.5)
eGFR: 90 mL/min/{1.73_m2} (ref >59)

## 2023-06-13 LAB — CBC WITH DIFFERENTIAL/PLATELET
Basophils Absolute: 0.1 10*3/uL (ref 0.0–0.2)
Basos: 1 %
EOS (ABSOLUTE): 0.7 10*3/uL — ABNORMAL HIGH (ref 0.0–0.4)
Eos: 7 %
Hematocrit: 46.5 % (ref 34.0–46.6)
Hemoglobin: 14.9 g/dL (ref 11.1–15.9)
Immature Grans (Abs): 0 10*3/uL (ref 0.0–0.1)
Immature Granulocytes: 0 %
Lymphocytes Absolute: 2.1 10*3/uL (ref 0.7–3.1)
Lymphs: 21 %
MCH: 29.5 pg (ref 26.6–33.0)
MCHC: 32 g/dL (ref 31.5–35.7)
MCV: 92 fL (ref 79–97)
Monocytes Absolute: 0.7 10*3/uL (ref 0.1–0.9)
Monocytes: 7 %
Neutrophils Absolute: 6.4 10*3/uL (ref 1.4–7.0)
Neutrophils: 64 %
Platelets: 220 10*3/uL (ref 150–450)
RBC: 5.05 x10E6/uL (ref 3.77–5.28)
RDW: 12.9 % (ref 11.7–15.4)
WBC: 10 10*3/uL (ref 3.4–10.8)

## 2023-06-13 LAB — AFP TUMOR MARKER: AFP, Serum, Tumor Marker: 2.9 ng/mL (ref 0.0–9.2)

## 2023-06-13 LAB — PROTIME-INR
INR: 1 (ref 0.9–1.2)
Prothrombin Time: 11.2 s (ref 9.1–12.0)

## 2023-06-15 ENCOUNTER — Ambulatory Visit (HOSPITAL_COMMUNITY)
Admission: RE | Admit: 2023-06-15 | Discharge: 2023-06-15 | Disposition: A | Discharge status: Home / Self Care | Payer: Medicaid Other | Source: Ambulatory Visit | Attending: Internal Medicine | Admitting: Internal Medicine

## 2023-06-15 DIAGNOSIS — N281 Cyst of kidney, acquired: Secondary | ICD-10-CM | POA: Diagnosis not present

## 2023-06-15 DIAGNOSIS — K7460 Unspecified cirrhosis of liver: Secondary | ICD-10-CM | POA: Diagnosis present

## 2023-06-15 DIAGNOSIS — K760 Fatty (change of) liver, not elsewhere classified: Secondary | ICD-10-CM | POA: Diagnosis not present

## 2023-07-17 ENCOUNTER — Telehealth: Payer: Self-pay

## 2023-07-17 ENCOUNTER — Ambulatory Visit: Admitting: Student in an Organized Health Care Education/Training Program

## 2023-07-17 ENCOUNTER — Encounter: Payer: Self-pay | Admitting: Student in an Organized Health Care Education/Training Program

## 2023-07-17 VITALS — BP 100/60 | HR 75 | Temp 98.0°F | Ht 66.0 in | Wt 147.4 lb

## 2023-07-17 DIAGNOSIS — J9601 Acute respiratory failure with hypoxia: Secondary | ICD-10-CM

## 2023-07-17 DIAGNOSIS — Z9289 Personal history of other medical treatment: Secondary | ICD-10-CM

## 2023-07-17 DIAGNOSIS — F17200 Nicotine dependence, unspecified, uncomplicated: Secondary | ICD-10-CM

## 2023-07-17 DIAGNOSIS — F17219 Nicotine dependence, cigarettes, with unspecified nicotine-induced disorders: Secondary | ICD-10-CM

## 2023-07-17 DIAGNOSIS — R0602 Shortness of breath: Secondary | ICD-10-CM

## 2023-07-17 LAB — NITRIC OXIDE: Nitric Oxide: 9

## 2023-07-17 MED ORDER — SPIRIVA RESPIMAT 2.5 MCG/ACT IN AERS: 2.0000 | INHALATION_SPRAY | Freq: Every day | RESPIRATORY_TRACT | 11 refills | Status: AC

## 2023-07-17 MED ORDER — NICOTINE POLACRILEX 2 MG MT LOZG: 2.0000 mg | LOZENGE | OROMUCOSAL | 3 refills | Status: AC | PRN

## 2023-07-17 MED ORDER — NICOTINE 7 MG/24HR TD PT24: 7.0000 mg | MEDICATED_PATCH | TRANSDERMAL | 0 refills | Status: AC

## 2023-07-17 MED ORDER — NICOTINE 21 MG/24HR TD PT24: 21.0000 mg | MEDICATED_PATCH | TRANSDERMAL | 0 refills | Status: AC

## 2023-07-17 MED ORDER — NICOTINE 14 MG/24HR TD PT24: 14.0000 mg | MEDICATED_PATCH | TRANSDERMAL | 0 refills | Status: AC

## 2023-07-17 NOTE — Patient Instructions (Signed)
 The [facility] Quitline: Call 1-800-QUIT-NOW (980-106-0528). The [place] Quitline is a free service for [employer]. Trained counselors are available from 8 am until 3 am, 365 days per year. Services are available in both [place] and [place].   Web Resources Free online support programs can help you track your progress and share experiences with others who are quitting. These are examples: www.becomeanex.org www.trytostop.org  www.smokefree.gov  www.https://www.vargas.com/.aspx   Tobacco Cessation Medications  Nicotine Replacement Therapy (NRT)  Nicotine is the addictive part of tobacco smoke, but not the most dangerous part. There are 7000 other toxins in cigarettes, including carbon monoxide, that cause disease. People do not generally become addicted to medication. Common problems: People don't use enough medication or stop too early. Medications are safe and effective. Overdose is very uncommon. Use medications as long as needed (3 months minimum). Some combinations work better than single medications. Long acting medications like the NRT patch and bupropion provide continuous treatment for withdrawal symptoms.  PLUS  Short acting medications like the NRT gum, lozenge, inhaler, and nasal spray help people to cope with breakthrough cravings.  ? Nicotine Patch  Place patch on hairless skin on upper body, including arms and back. Each day: discard old patch, shower, apply new patch to a different site. Apply hydrocortisone cream to mildly red/irritated areas. Call provider if rash develops. If patch causes sleep disturbance, remove patch at bedtime and replace each morning after shower. Side effects may include: skin irritation, headache, insomnia, abnormal/vivid dreams.  ? Nicotine Gum  Chew gum slowly, park in cheek when peppery taste or tingling sensation begins (about 15-30 chews). When taste or tingling goes away, begin chewing again. Use  until nicotine is gone (taste or tingle does not return, usually 30 minutes). Park in different areas of mouth. Nicotine is absorbed through the lining of the mouth. Use enough to control cravings, up to 24 pieces per day (if used alone). Avoid eating or drinking for 15 minutes before using and during use. Side effects may include: mouth/jaw soreness, hiccups, indigestion, hypersalivation.  If gum is not chewed correctly, additional side effects may include lightheadedness, nausea/vomiting, throat and mouth irritation.  ? Nicotine Lozenge  Allow to dissolve slowly in mouth (20-30 minutes). Do not chew or swallow. Nicotine release may cause a warm tingling sensation. Occasionally rotate to different areas of the mouth. Use enough to control cravings, up to 20 lozenges per day (if used alone). Avoid eating or drinking for 15 minutes before using and during use. Side effects may include: nausea, hiccups, cough, heartburn, headache, gas, insomnia.  ? Nicotine Nasal Spray Use 1 spray in each nostril (1 dose) and tilt head back for 1 minute. Do not sniff, swallow, or inhale through nose.  Use at least 8 doses (1 spray in each nostril) , up to 40 doses per day (if used alone). To reduce nasal irritation, spray on cotton swab and insert into nose. Side effects may include: nasal and/or throat irritation (hot, peppery, or burning sensation), nasal irritation, tearing, sneezing, cough, headache.  ? Nicotine Oral Inhaler (puffer) Inhale into the back of the throat or puff in short breaths. Do not inhale into the lungs.  Puff continuously for 20 minutes (about 80 puffs) until cartridge is empty. Change cartridge when it loses the "burning in throat" sensation (feels like air only). Open cartridges can be saved and used again within 24 hours. Use at least 6 and up to 16 cartridges per day (if used alone).  Avoid eating  or drinking for 15 minutes before using and during use. Side effects may include:  mouth and/or throat irritation, unpleasant taste, cough, nasal irritation, indigestion, hiccups, headache.  ? Chantix (varenicline) Days 1-3: Take one 0.5 mg white pill each morning for 3 days, one week before quit date. Days 4-7: Increase to one 0.5 mg white pill twice a day in morning and evening for 4 days.  On Day 8 (target quit date), increase to one 1 mg blue pill twice a day. Maintain this dose for a minimum of 3 months. Take with food and a full glass of water to reduce nausea. Be sure that the two doses are at least 8 hours apart, but try to take second dose early in the evening (i.e. 6 pm) to avoid sleep problems. Common side effects include: nausea, insomnia, headache, abnormal/vivid dreams. Tell your doctor if you have any history of psychiatric illness prior to starting Chantix.  STOP taking CHANTIX and contact a healthcare provider immediately if you experience agitation, hostility, depressed mood, changes in thoughts or behavior that are not typical for you, thinking about or attempting suicide, allergic or skin reactions including swelling, rash, redness, or peeling of the skin.  For patients who have heart disease: Smoking is a major risk factor for cardiovascular disease, and Chantix can help you quit smoking. Chantix may be associated with a small, increased risk of certain heart events in patients who have heart disease. If you have any new or worsening symptoms of heart disease while taking Chantix, such as shortness of breath or trouble breathing, new or worsening chest pain, or new or worsening pain in your legs when walking, call your doctor or get emergency medical help immediately.  ? Wellbutrin / Zyban (bupropion) Take one 150 mg pill each morning for 3 days, one week before target quit date. On Day 4, increase to one 150 mg pill twice a day, morning and evening.  Maintain this dose for a minimum of 3 months. Be sure that the two doses are at least 8 hours apart, but try  to take second dose early in the evening (i.e. 6 pm) to avoid sleep problems. Avoid or minimize use of alcohol when taking this medication. Common side effects include: dry mouth, headache, insomnia, nausea, weight loss.  Risk of seizure is 05/998. STOP taking BUPROPION and contact a healthcare provider immediately if you experience agitation, hostility, depressed mood, changes in thoughts or behavior that are not typical for you, thinking about or attempting suicide, allergic or skin reactions including swelling, rash, redness, or peeling of the skin.

## 2023-07-17 NOTE — Progress Notes (Signed)
 Assessment & Plan:   #Acute Hypoxic Respiratory Failure #Shortness of Breath  Presented for the evaluation of shortness of breath following a recent hospitalization for presumed COPD exacerbation and acute hypoxic respiratory failure.  Patient was on 2 L via nasal cannula on presentation today to clinic, saturating 95%. We trended her on room air and oxygen levels maintained at 93% while walking in the hallway.  Physical exam is notable for good air entry with mild end expiratory wheezes.    Reviewed report of her previous chest CT notable for scattered groundglass opacities which we will attempt to port into our system for comparison. This could represent an atypical/viral infection, respiratory bronchiolitis, or mucus plugging.  FeNO in clinic today was normal though this might not be representative given she recently had a course of steroids.  Historically, peak eosinophil count was 700 in February 2025 which could point towards asthma or asthma/COPD overlap.    I will obtain a pulmonary function test to assess spirometry for obstruction, lung volumes for air trapping, and DLCO for any reduction.  We will continue with Symbicort and add LAMA with Spiriva Respimat.  I will also obtain a repeat CT scan of the chest in 2 months time to reevaluate the lymph node as well as the groundglass opacities.  - Nitric oxide - Pulmonary Function Test [facility] Only; Future - Tiotropium Bromide Monohydrate (SPIRIVA RESPIMAT) 2.5 MCG/ACT AERS; Inhale 2 puffs into the lungs daily.  Dispense: 60 each; Refill: 11 > inhaler training provided - CT CHEST WO CONTRAST; Future - Oxygen therapy with exertion and nocturnally, will continue to work towards full wean on follow up  #Tobacco use disorder  Counseled at length on the importance of smoking cessation.  Cessation aid prescription sent.  - nicotine (NICODERM CQ - DOSED IN MG/24 HOURS) 21 mg/24hr patch; Place 1 patch (21 mg total) onto the skin daily.  Dispense:  42 patch; Refill: 0 - nicotine (NICODERM CQ - DOSED IN MG/24 HOURS) 14 mg/24hr patch; Place 1 patch (14 mg total) onto the skin daily for 14 days.  Dispense: 14 patch; Refill: 0 - nicotine (NICODERM CQ - DOSED IN MG/24 HR) 7 mg/24hr patch; Place 1 patch (7 mg total) onto the skin daily for 14 days.  Dispense: 14 patch; Refill: 0 - nicotine polacrilex (NICOTINE MINI) 2 MG lozenge; Take 1 lozenge (2 mg total) by mouth every 2 (two) hours as needed for smoking cessation.  Dispense: 72 lozenge; Refill: 3   Return in about 3 months (around 10/17/2023).  I spent 64 minutes caring for this patient today, including preparing to see the patient, obtaining a medical history , reviewing a separately obtained history, performing a medically appropriate examination and/or evaluation, counseling and educating the patient/family/caregiver, ordering medications, tests, or procedures, and documenting clinical information in the electronic health record. 3 minutes spent counseling patient on importance of smoking cessation.  Raechel Chute, MD [facility]  End of visit medications:  Meds ordered this encounter  Medications   Tiotropium Bromide Monohydrate (SPIRIVA RESPIMAT) 2.5 MCG/ACT AERS    Sig: Inhale 2 puffs into the lungs daily.    Dispense:  60 each    Refill:  11   nicotine (NICODERM CQ - DOSED IN MG/24 HOURS) 21 mg/24hr patch    Sig: Place 1 patch (21 mg total) onto the skin daily.    Dispense:  42 patch    Refill:  0   nicotine (NICODERM CQ - DOSED IN MG/24 HOURS)  14 mg/24hr patch    Sig: Place 1 patch (14 mg total) onto the skin daily for 14 days.    Dispense:  14 patch    Refill:  0   nicotine (NICODERM CQ - DOSED IN MG/24 HR) 7 mg/24hr patch    Sig: Place 1 patch (7 mg total) onto the skin daily for 14 days.    Dispense:  14 patch    Refill:  0   nicotine polacrilex (NICOTINE MINI) 2 MG lozenge    Sig: Take 1 lozenge (2 mg total) by mouth every 2 (two) hours as needed  for smoking cessation.    Dispense:  72 lozenge    Refill:  3     Current Outpatient Medications:    albuterol (VENTOLIN HFA) 108 (90 Base) MCG/ACT inhaler, Inhale 2 puffs into the lungs every 6 (six) hours as needed for wheezing or shortness of breath., Disp: 6.7 g, Rfl: 0   allopurinol (ZYLOPRIM) 300 MG tablet, Take 300 mg by mouth daily., Disp: , Rfl:    B Complex Vitamins (B COMPLEX-B12) TABS, Take by mouth., Disp: , Rfl:    budesonide-formoterol (SYMBICORT) 80-4.5 MCG/ACT inhaler, Inhale 2 puffs into the lungs 2 (two) times daily., Disp: , Rfl:    celecoxib (CELEBREX) 100 MG capsule, Take 100 mg by mouth 2 (two) times daily., Disp: , Rfl:    colchicine 0.6 MG tablet, , Disp: , Rfl:    cyanocobalamin (VITAMIN B12) 1000 MCG tablet, Take 1 tablet by mouth daily., Disp: , Rfl:    dicyclomine (BENTYL) 10 MG capsule, Take 10 mg by mouth daily., Disp: , Rfl:    DULoxetine (CYMBALTA) 30 MG capsule, Take 1 capsule (30 mg total) by mouth daily., Disp: 30 capsule, Rfl: 3   folic acid (FOLVITE) 1 MG tablet, Take 1 mg by mouth daily., Disp: , Rfl:    ibuprofen (ADVIL) 200 MG tablet, Take 400 mg by mouth every 8 (eight) hours as needed for mild pain (pain score 1-3)., Disp: , Rfl:    levothyroxine (SYNTHROID) 50 MCG tablet, Take 50 mcg by mouth daily before breakfast. Patient reports she takes 50 mcg daily not 75 mcg, Disp: , Rfl:    nicotine (NICODERM CQ - DOSED IN MG/24 HOURS) 14 mg/24hr patch, Place 1 patch (14 mg total) onto the skin daily for 14 days., Disp: 14 patch, Rfl: 0   nicotine (NICODERM CQ - DOSED IN MG/24 HOURS) 21 mg/24hr patch, Place 1 patch (21 mg total) onto the skin daily., Disp: 42 patch, Rfl: 0   nicotine (NICODERM CQ - DOSED IN MG/24 HR) 7 mg/24hr patch, Place 1 patch (7 mg total) onto the skin daily for 14 days., Disp: 14 patch, Rfl: 0   nicotine polacrilex (NICOTINE MINI) 2 MG lozenge, Take 1 lozenge (2 mg total) by mouth every 2 (two) hours as needed for smoking cessation.,  Disp: 72 lozenge, Rfl: 3   OXYGEN, Inhale 2 L into the lungs at bedtime. PRN during the day, Disp: , Rfl:    SYMBICORT 80-4.5 MCG/ACT inhaler, SMARTSIG:2 Puff(s) By Mouth Twice Daily, Disp: , Rfl:    Tiotropium Bromide Monohydrate (SPIRIVA RESPIMAT) 2.5 MCG/ACT AERS, Inhale 2 puffs into the lungs daily., Disp: 60 each, Rfl: 11   diazepam (VALIUM) 2 MG tablet, Take 2 mg by mouth every 6 (six) hours as needed for anxiety. (Patient not taking: Reported on 07/17/2023), Disp: , Rfl:    Subjective:   PATIENT ID: Tara Lewis GENDER: female DOB: 07/07/1965, MRN: [id number]  Chief Complaint  Patient presents with   Consult    Cough. Shortness of breath on exertion.    HPI  Patient is a pleasant 58 year old female with a past medical history of alcohol use disorder and hypothyroidism who presents today for the evaluation of shortness of breath and hypoxia.  Onset was around November 2024 when she experienced weakness and abdominal discomfort and found to have C. difficile and pancreatitis.  She continues to be weak after said admission with the subsequent development of progressive shortness of breath and associated cough.  She was seen by her primary care provider who noted her to be very hypoxic with an SpO2 of 60%.  She was asked to go to the emergency department where she was admitted to [facility] for acute hypoxic respiratory failure secondary to presumed COPD exacerbation.  In the hospital, she was initially on high flow nasal cannula subsequently trended down to 2 L of [place].  She was treated with a course of steroids as well as broad-spectrum antibiotics with improvement.  A CT scan of the chest ruled out PE and was notable for some groundglass opacities as well as right hilar lymph nodes.  Patient is chronically maintained on Symbicort which was continued on discharge.  Patient previously worked as a Interior and spatial designer and reported some respiratory symptoms ongoing at work.  She has a longstanding history  of smoking, having smoked between 1 and 1.5 pack/day since the age of 51, with at least 40 pack years of smoking history.  She continues to smoke at this point.  Ancillary information including prior medications, full medical/surgical/family/social histories, and PFTs (when available) are listed below and have been reviewed.   Review of Systems  Constitutional:  Positive for diaphoresis. Negative for chills, fever and weight loss.  Respiratory:  Positive for cough, sputum production, shortness of breath and wheezing. Negative for hemoptysis.   Cardiovascular:  Negative for chest pain and leg swelling.     Objective:   Vitals:   07/17/23 0958  BP: 100/60  Pulse: 75  Temp: 98 F (36.7 C)  TempSrc: Temporal  SpO2: 95%  Weight: 147 lb 6.4 oz (66.9 kg)  Height: 5\' 6"  (1.676 m)   95% on 2 LPM, trended on room air, maintained SpO2 of 93%  BMI Readings from Last 3 Encounters:  07/17/23 23.79 kg/m  06/12/23 23.53 kg/m  05/09/23 21.79 kg/m   Wt Readings from Last 3 Encounters:  07/17/23 147 lb 6.4 oz (66.9 kg)  06/12/23 145 lb 12.8 oz (66.1 kg)  05/09/23 135 lb (61.2 kg)    Physical Exam Constitutional:      Appearance: Normal appearance.  Cardiovascular:     Rate and Rhythm: Normal rate and regular rhythm.     Pulses: Normal pulses.     Heart sounds: Normal heart sounds.  Pulmonary:     Effort: Pulmonary effort is normal.     Breath sounds: Wheezing (end expiratory wheeze) present. No rhonchi or rales.  Neurological:     General: No focal deficit present.     Mental Status: She is alert and oriented to person, place, and time. Mental status is at baseline.       Ancillary Information    Past Medical History:  Diagnosis Date   Anxiety    Carpal tunnel syndrome    COPD (chronic obstructive pulmonary disease) (HCC)    Depression    Fibromyalgia    GERD (gastroesophageal reflux disease)    Graves' disease    Hypothyroidism  Family History  Problem  Relation Age of Onset   Kidney disease Mother    Thyroid disease Mother    Fibromyalgia Mother    Asthma Mother    Hypertension Father    Heart attack Father        3 heart attacks   Heart disease Father    Thyroid disease Sister    Fibromyalgia Sister      Past Surgical History:  Procedure Laterality Date   ABDOMINAL HYSTERECTOMY  2000s   BIOPSY  05/09/2023   Procedure: BIOPSY;  Surgeon: Corbin Ade, MD;  [facility];  Service: Endoscopy;;   COLONOSCOPY WITH PROPOFOL N/A 05/09/2023   Procedure: COLONOSCOPY WITH PROPOFOL;  Surgeon: Corbin Ade, MD;  Location: [facility];  Service: Endoscopy;  Laterality: N/A;  200pm, asa 4   ESOPHAGOGASTRODUODENOSCOPY (EGD) WITH PROPOFOL N/A 05/09/2023   Procedure: ESOPHAGOGASTRODUODENOSCOPY (EGD) WITH PROPOFOL;  Surgeon: Corbin Ade, MD;  [facility];  Service: Endoscopy;  Laterality: N/A;   KNEE SURGERY Right    MALONEY DILATION  05/09/2023   Procedure: MALONEY DILATION;  Surgeon: Corbin Ade, MD;  Location: [facility];  Service: Endoscopy;;   OVARIAN CYST SURGERY      Social History   Socioeconomic History   Marital status: Widowed    Spouse name: Not on file   Number of children: Not on file   Years of education: Not on file   Highest education level: Not on file  Occupational History   Not on file  Tobacco Use   Smoking status: Every Day    Current packs/day: 1.00    Average packs/day: 1 pack/day for 40.2 years (40.2 ttl pk-yrs)    Types: Cigarettes    Start date: [birth year]   Smokeless tobacco: Never  Vaping Use   Vaping status: Some Days   Substances: Nicotine  Substance and Sexual Activity   Alcohol use: Not Currently    Comment: 3-4 cans of beer/daily   Drug use: No   Sexual activity: Never    Birth control/protection: Surgical    Comment: last time approx october 2017  Other Topics Concern   Not on file  Social History Narrative   Are you right handed or left handed? Right Handed     Are you currently employed ? Yes   What is your current occupation? [employer]    Do you live at home alone? Yes   Who lives with you? Boyfriend stays with her sometimes    What type of home do you live in: 1 story or 2 story? Lives in a one story home        Social Drivers of Health   Financial Resource Strain: Low Risk  (06/29/2023)   Received from [facility]   Overall Financial Resource Strain (CARDIA)    Difficulty of Paying Living Expenses: Not hard at all  Recent Concern: Financial Resource Strain - Medium Risk (04/28/2023)   Received from [facility]   Overall Financial Resource Strain (CARDIA)    Difficulty of Paying Living Expenses: Somewhat hard  Food Insecurity: No Food Insecurity (06/29/2023)   Received from [facility]   Hunger Vital Sign    Worried About Running Out of Food in the Last Year: Never true    Ran Out of Food in the Last Year: Never true  Transportation Needs: Unmet Transportation Needs (06/29/2023)   Received from [facility]   [facility] - Transportation  Lack of Transportation (Medical): Yes    Lack of Transportation (Non-Medical): No  Physical Activity: Insufficiently Active (06/29/2023)   Received from [facility]   Exercise Vital Sign    Days of Exercise per Week: 3 days    Minutes of Exercise per Session: 30 min  Stress: Stress Concern Present (06/29/2023)   Received from [facility] - Occupational Stress Questionnaire    Feeling of Stress : To some extent  Social Connections: Socially Integrated (06/29/2023)   Received from [facility]   Social Connection and Isolation Panel [NHANES]    Frequency of Communication with Friends and Family: More than three times a week    Frequency of Social Gatherings with Friends and Family: Three times a week    Attends Religious Services: More than 4 times per year    Active Member of Clubs or Organizations: No    Attends Hospital doctor: More than 4 times per year    Marital Status: Married  Recent Concern: Social Connections - Somewhat Isolated (04/28/2023)   Received from [facility]   Social Network    How would you rate your social network (family, work, friends)?: Restricted participation with some degree of social isolation  Intimate Partner Violence: Not At Risk (06/29/2023)   Received from [facility]   Humiliation, Afraid, Rape, and Kick questionnaire    Fear of Current or Ex-Partner: No    Emotionally Abused: No    Physically Abused: No    Sexually Abused: No     Allergies  Allergen Reactions   Tape Other (See Comments)    Skin tears easily   Codeine Other (See Comments)    Patient reports stomach pain.   Statins Other (See Comments)    Makes legs hurt     CBC    Component Value Date/Time   WBC 10.0 06/12/2023 1038   WBC 8.1 06/12/2016 0727   RBC 5.05 06/12/2023 1038   RBC 4.74 06/12/2016 0727   HGB 14.9 06/12/2023 1038   HCT 46.5 06/12/2023 1038   PLT 220 06/12/2023 1038   MCV 92 06/12/2023 1038   MCH 29.5 06/12/2023 1038   MCH 30.8 06/12/2016 0727   MCHC 32.0 06/12/2023 1038   MCHC 33.6 06/12/2016 0727   RDW 12.9 06/12/2023 1038   LYMPHSABS 2.1 06/12/2023 1038   MONOABS 648 06/12/2016 0727   EOSABS 0.7 (H) 06/12/2023 1038   BASOSABS 0.1 06/12/2023 1038    Pulmonary Functions Testing Results:     No data to display          Outpatient Medications Prior to Visit  Medication Sig Dispense Refill   albuterol (VENTOLIN HFA) 108 (90 Base) MCG/ACT inhaler Inhale 2 puffs into the lungs every 6 (six) hours as needed for wheezing or shortness of breath. 6.7 g 0   allopurinol (ZYLOPRIM) 300 MG tablet Take 300 mg by mouth daily.     B Complex Vitamins (B COMPLEX-B12) TABS Take by mouth.     budesonide-formoterol (SYMBICORT) 80-4.5 MCG/ACT inhaler Inhale 2 puffs into the lungs 2 (two) times daily.     celecoxib (CELEBREX) 100 MG capsule Take 100 mg by mouth 2 (two) times  daily.     colchicine 0.6 MG tablet      cyanocobalamin (VITAMIN B12) 1000 MCG tablet Take 1 tablet by mouth daily.     dicyclomine (BENTYL) 10 MG capsule Take 10 mg by mouth daily.  DULoxetine (CYMBALTA) 30 MG capsule Take 1 capsule (30 mg total) by mouth daily. 30 capsule 3   folic acid (FOLVITE) 1 MG tablet Take 1 mg by mouth daily.     ibuprofen (ADVIL) 200 MG tablet Take 400 mg by mouth every 8 (eight) hours as needed for mild pain (pain score 1-3).     levothyroxine (SYNTHROID) 50 MCG tablet Take 50 mcg by mouth daily before breakfast. Patient reports she takes 50 mcg daily not 75 mcg     OXYGEN Inhale 2 L into the lungs at bedtime. PRN during the day     SYMBICORT 80-4.5 MCG/ACT inhaler SMARTSIG:2 Puff(s) By Mouth Twice Daily     diazepam (VALIUM) 2 MG tablet Take 2 mg by mouth every 6 (six) hours as needed for anxiety. (Patient not taking: Reported on 07/17/2023)     No facility-administered medications prior to visit.

## 2023-07-17 NOTE — Telephone Encounter (Signed)
 Images have been requested

## 2023-07-17 NOTE — Telephone Encounter (Signed)
 Per Dr. Aundria Rud, patient has had CT chest at [facility]. Can we please port it into our PACS.

## 2023-07-18 ENCOUNTER — Encounter: Payer: Self-pay | Admitting: Student in an Organized Health Care Education/Training Program

## 2023-07-18 ENCOUNTER — Telehealth: Payer: Self-pay | Admitting: Student in an Organized Health Care Education/Training Program

## 2023-07-18 DIAGNOSIS — J9601 Acute respiratory failure with hypoxia: Secondary | ICD-10-CM

## 2023-07-18 DIAGNOSIS — R0602 Shortness of breath: Secondary | ICD-10-CM

## 2023-07-18 MED ORDER — AEROCHAMBER MV MISC: 0 refills | Status: AC

## 2023-07-18 MED ORDER — BUDESONIDE-FORMOTEROL FUMARATE 160-4.5 MCG/ACT IN AERO: 2.0000 | INHALATION_SPRAY | Freq: Two times a day (BID) | RESPIRATORY_TRACT | 12 refills | Status: AC

## 2023-07-18 NOTE — Telephone Encounter (Signed)
 PT states when she was in yesterday the flutter valve was not "called in" Please call to advise her when and where it will be coming to her. Her # is [phone]

## 2023-07-18 NOTE — Telephone Encounter (Signed)
 Duplicate encounter

## 2023-07-26 NOTE — Telephone Encounter (Signed)
 NFN

## 2023-08-07 ENCOUNTER — Inpatient Hospital Stay
Admission: RE | Admit: 2023-08-07 | Discharge: 2023-08-07 | Disposition: A | Discharge status: Home / Self Care | Payer: Self-pay | Source: Ambulatory Visit | Attending: Student in an Organized Health Care Education/Training Program

## 2023-08-07 DIAGNOSIS — Z9289 Personal history of other medical treatment: Secondary | ICD-10-CM

## 2023-08-08 ENCOUNTER — Encounter: Payer: Self-pay | Admitting: Internal Medicine

## 2023-08-09 ENCOUNTER — Telehealth: Payer: Self-pay

## 2023-08-09 NOTE — Telephone Encounter (Signed)
 I have left a message for Tara Lewis to call me back to schedule this CT it can be done at [facility] or [facility]

## 2023-08-09 NOTE — Telephone Encounter (Signed)
 Left message for Tara Lewis advising that scan needs to be completed by the end of April.   Copied from CRM [id number]. Topic: General - Other >> Aug 08, 2023 12:57 PM [name] wrote: Reason for CRM: Tara Lewis (Nurse Care Specialist) from [facility] [phone]. Would like a call back regarding CT that Dr Aundria Rud was to order a couple weeks ago. Patient has not received a call.

## 2023-08-13 ENCOUNTER — Encounter: Payer: Self-pay | Admitting: Internal Medicine

## 2023-08-13 NOTE — Telephone Encounter (Signed)
Routing to you in Dr. Luvenia Starch absence.

## 2023-08-14 NOTE — Telephone Encounter (Signed)
 Images are loaded into [employer].

## 2023-08-16 NOTE — Telephone Encounter (Addendum)
 FYI- Her images are in [place] they are having trouble getting them uploaded into Epic.

## 2023-08-20 NOTE — Telephone Encounter (Signed)
 I have spoke with Mrs. Tara Lewis and her CT has been scheduled on 08/29/2023  @ 6:00pm at [facility] and she is aware of the appt and location

## 2023-08-27 ENCOUNTER — Ambulatory Visit: Payer: Medicaid Other | Admitting: Neurology

## 2023-08-27 ENCOUNTER — Encounter: Payer: Self-pay | Admitting: Internal Medicine

## 2023-08-27 ENCOUNTER — Ambulatory Visit: Admitting: Internal Medicine

## 2023-08-27 VITALS — BP 118/77 | HR 76 | Temp 98.4°F | Ht 66.0 in | Wt 156.0 lb

## 2023-08-27 DIAGNOSIS — K7030 Alcoholic cirrhosis of liver without ascites: Secondary | ICD-10-CM | POA: Diagnosis not present

## 2023-08-27 DIAGNOSIS — F1090 Alcohol use, unspecified, uncomplicated: Secondary | ICD-10-CM

## 2023-08-27 DIAGNOSIS — R1013 Epigastric pain: Secondary | ICD-10-CM

## 2023-08-27 NOTE — Patient Instructions (Addendum)
 Good to see you again today!  Your recent abdominal pain likely due to excessive NSAID medication (Celebrex and ibuprofen)  I would like you to stop the Celebrex and cut back at least by half on the ibuprofen(ideally even more and supplement with Tylenol)  NSAID medication likely damaging to your stomach lining.  New medication for now: Protonix 40 mg pill take (1) 30 minutes before breakfast and supper.  Dispense 60 with 3 refills  If you are not feeling better in 2 weeks,  further diagnostic evaluation may be needed  Please call me in 2 weeks with a progress report  Office visit here in 6 weeks

## 2023-08-27 NOTE — Progress Notes (Unsigned)
 Primary Care Physician:  Sherma Diver, NP Primary Gastroenterologist:  Dr.   Pre-Procedure History & Physical: HPI:  Tara Lewis is a 58 y.o. female here for 2-week history of bandlike upper abdominal pain.  History of well compensated EtOH related cirrhosis.  Portal gastropathy found on recent EGD.  It is notable patient's pain started 2 weeks ago which temporally correlates with spine ablation therapy and the institution of both Celebrex and 1200 mg ibuprofen daily.  No concomitant PPI.  Patient has not had any melena or rectal bleeding nausea or vomiting.    Gall bladder remains in situ.  Again, no alcohol whatsoever since November of last year. Past Medical History:  Diagnosis Date   Anxiety    Carpal tunnel syndrome    COPD (chronic obstructive pulmonary disease) (HCC)    Depression    Fibromyalgia    GERD (gastroesophageal reflux disease)    Graves' disease    Hypothyroidism     Past Surgical History:  Procedure Laterality Date   ABDOMINAL HYSTERECTOMY  2000s   BIOPSY  05/09/2023   Procedure: BIOPSY;  Surgeon: Suzette Espy, MD;  [facility]: [facility];  Service: Endoscopy;;   COLONOSCOPY WITH PROPOFOL  N/A 05/09/2023   Procedure: COLONOSCOPY WITH PROPOFOL ;  Surgeon: Suzette Espy, MD;  Location: [facility];  Service: Endoscopy;  Laterality: N/A;  200pm, asa 4   ESOPHAGOGASTRODUODENOSCOPY (EGD) WITH PROPOFOL  N/A 05/09/2023   Procedure: ESOPHAGOGASTRODUODENOSCOPY (EGD) WITH PROPOFOL ;  Surgeon: Suzette Espy, MD;  Location: [facility];  Service: Endoscopy;  Laterality: N/A;   KNEE SURGERY Right    MALONEY DILATION  05/09/2023   Procedure: MALONEY DILATION;  Surgeon: Suzette Espy, MD;  Location: [facility];  Service: Endoscopy;;   OVARIAN CYST SURGERY      Prior to Admission medications   Medication Sig Start Date End Date Taking? Authorizing Provider  albuterol  (VENTOLIN  HFA) 108 (90 Base) MCG/ACT inhaler Inhale 2 puffs into the lungs every 6  (six) hours as needed for wheezing or shortness of breath. 07/21/20  Yes Luane Rumps, PA-C  allopurinol (ZYLOPRIM) 300 MG tablet Take 300 mg by mouth daily.   Yes [provider]  B Complex Vitamins (B COMPLEX-B12) TABS Take by mouth.   Yes [provider]  budesonide -formoterol  (SYMBICORT ) 160-4.5 MCG/ACT inhaler Inhale 2 puffs into the lungs in the morning and at bedtime. 07/18/23  Yes Dgayli, Berneta Brightly, MD  celecoxib (CELEBREX) 100 MG capsule Take 100 mg by mouth 2 (two) times daily.   Yes [provider]  colchicine 0.6 MG tablet  10/06/22  Yes [provider]  cyanocobalamin (VITAMIN B12) 1000 MCG tablet Take 1 tablet by mouth daily. 03/15/23 03/14/24 Yes [provider]  dicyclomine (BENTYL) 10 MG capsule Take 10 mg by mouth daily. 10/06/21  Yes [provider]  DULoxetine  (CYMBALTA ) 30 MG capsule Take 1 capsule (30 mg total) by mouth daily. 02/06/23  Yes Patel, Donika K, DO  folic acid (FOLVITE) 1 MG tablet Take 1 mg by mouth daily.   Yes [provider]  ibuprofen (ADVIL) 200 MG tablet Take 400 mg by mouth every 8 (eight) hours as needed for mild pain (pain score 1-3).   Yes [provider]  levothyroxine  (SYNTHROID ) 50 MCG tablet Take 50 mcg by mouth daily before breakfast. Patient reports she takes 50 mcg daily not 75 mcg   Yes [provider]  lidocaine  (LIDODERM ) 5 % Place 1 patch onto the skin daily.  08/03/23  Yes [provider]  methocarbamol (ROBAXIN) 500 MG tablet Take 500-1,000 mg by mouth 3 (three) times daily.   Yes [provider]  nicotine  (NICODERM CQ  - DOSED IN MG/24 HOURS) 21 mg/24hr patch Place 1 patch (21 mg total) onto the skin daily. 07/17/23 08/28/23 Yes Dgayli, Berneta Brightly, MD  nicotine  polacrilex (NICOTINE  MINI) 2 MG lozenge Take 1 lozenge (2 mg total) by mouth every 2 (two) hours as needed for smoking cessation. 07/17/23 10/15/23 Yes Dgayli, Berneta Brightly, MD  OXYGEN Inhale 2 L into the lungs  at bedtime. PRN during the day   Yes [provider]  Spacer/Aero-Holding Chambers (AEROCHAMBER MV) inhaler Use as instructed 07/18/23  Yes Dgayli, Berneta Brightly, MD  Tiotropium Bromide Monohydrate  (SPIRIVA  RESPIMAT) 2.5 MCG/ACT AERS Inhale 2 puffs into the lungs daily. 07/17/23  Yes Vergia Glasgow, MD    Allergies as of 08/27/2023 - Review Complete 08/27/2023  Allergen Reaction Noted   Other Other (See Comments) 05/17/2017   Tape Other (See Comments) 07/12/2023   Codeine Other (See Comments) 05/11/2016   Statins Other (See Comments) 05/17/2017    Family History  Problem Relation Age of Onset   Kidney disease Mother    Thyroid  disease Mother    Fibromyalgia Mother    Asthma Mother    Hypertension Father    Heart attack Father        3 heart attacks   Heart disease Father    Thyroid  disease Sister    Fibromyalgia Sister     Social History   Socioeconomic History   Marital status: Widowed    Spouse name: Not on file   Number of children: Not on file   Years of education: Not on file   Highest education level: Not on file  Occupational History   Not on file  Tobacco Use   Smoking status: Every Day    Current packs/day: 1.00    Average packs/day: 1 pack/day for 40.3 years (40.3 ttl pk-yrs)    Types: Cigarettes    Start date: 77   Smokeless tobacco: Never  Vaping Use   Vaping status: Some Days   Substances: Nicotine   Substance and Sexual Activity   Alcohol use: Not Currently    Comment: 3-4 cans of beer/daily   Drug use: No   Sexual activity: Never    Birth control/protection: Surgical    Comment: last time approx october 2017  Other Topics Concern   Not on file  Social History Narrative   Are you right handed or left handed? Right Handed    Are you currently employed ? Yes   What is your current occupation? [employer]    Do you live at home alone? Yes   Who lives with you? Boyfriend stays with her sometimes    What type of home do you live in: 1 story or 2  story? Lives in a one story home        Social Drivers of Health   Financial Resource Strain: High Risk (07/23/2023)   Received from [facility]   Overall Financial Resource Strain (CARDIA)    Difficulty of Paying Living Expenses: Hard  Food Insecurity: No Food Insecurity (07/23/2023)   Received from [facility]   Hunger Vital Sign    Worried About Running Out of Food in the Last Year: Never true    Ran Out of Food in the Last Year: Never true  Transportation Needs: No Transportation Needs (07/23/2023)   Received from [facility]  PRAPARE - Administrator, Civil Service (Medical): No    Lack of Transportation (Non-Medical): No  Recent Concern: Transportation Needs - Unmet Transportation Needs (06/29/2023)   Received from [facility]   [facility] - Transportation    Lack of Transportation (Medical): Yes    Lack of Transportation (Non-Medical): No  Physical Activity: Unknown (07/23/2023)   Received from [facility]   Exercise Vital Sign    Days of Exercise per Week: 0 days    Minutes of Exercise per Session: Not on file  Recent Concern: Physical Activity - Insufficiently Active (06/29/2023)   Received from [facility]   Exercise Vital Sign    Days of Exercise per Week: 3 days    Minutes of Exercise per Session: 30 min  Stress: Stress Concern Present (07/23/2023)   Received from [facility] - Occupational Stress Questionnaire    Feeling of Stress : To some extent  Social Connections: Somewhat Isolated (07/23/2023)   Received from [facility]   Social Network    How would you rate your social network (family, work, friends)?: Restricted participation with some degree of social isolation  Intimate Partner Violence: Not At Risk (07/23/2023)   Received from [facility]   HITS    Over the last 12 months how often did your partner physically hurt you?: Never    Over the last 12 months how often did your partner  insult you or talk down to you?: Never    Over the last 12 months how often did your partner threaten you with physical harm?: Never    Over the last 12 months how often did your partner scream or curse at you?: Never    Review of Systems: See HPI, otherwise negative ROS  Physical Exam: BP 118/77 (BP Location: Right Arm, Patient Position: Sitting, Cuff Size: Normal)   Pulse 76   Temp 98.4 F (36.9 C) (Oral)   Ht 5\' 6"  (1.676 m)   Wt 156 lb (70.8 kg)   SpO2 96%   BMI 25.18 kg/m  General:   Alert,  Well-developed, well-nourished, pleasant and cooperative in NAD.  Accompanied by her significant other. Abdomen: Nondistended.  Positive bowel sounds soft with mild epigastric tenderness to palpation.  No appreciable mass organomegaly   Impression/Plan: 58 year old lady with well compensated alcohol use related cirrhosis presents with 2-week history of epigastric pain in the setting of high-dose NSAID use following back procedure.  She is taking no concomitant acid suppression therapy  Most likely her pain is related to NSAID gastropathy with gallbladder pancreatic or other etiologies less likely.  Clinically, no GI bleeding  Recommendations:  Stop the Celebrex and cut back at least by half on the ibuprofen(ideally even more and substitute with Tylenol)  Protonix 40 mg pill take (1) 30 minutes before breakfast and supper.  Dispense 60 with 3 refills  Please call me in 2 weeks with a progress report  Office visit here in 6 weeks     Notice: This dictation was prepared with Dragon dictation along with smaller phrase technology. Any transcriptional errors that result from this process are unintentional and may not be corrected upon review.

## 2023-08-28 ENCOUNTER — Other Ambulatory Visit: Payer: Self-pay

## 2023-08-28 MED ORDER — PANTOPRAZOLE SODIUM 40 MG PO TBEC: 40.0000 mg | DELAYED_RELEASE_TABLET | Freq: Two times a day (BID) | ORAL | 3 refills | Status: DC

## 2023-08-28 NOTE — Telephone Encounter (Signed)
 How long have your symptoms been going on for? X 2 night Any fevers, chills or sweats? No  Any cough? If so are you getting anything up? What color? Dry cough Any increased shortness or breath? When she is having the coughing fits. Any wheezing? A little yesterday.None today Do you monitor your oxygen at home? 96% at the GI doctor yesterday+  Spiriva - 2 puffs daily Symbicort - 2 puffs bid Albuterol  Inhaler- BID Albuterol  Neb- BID

## 2023-08-29 ENCOUNTER — Ambulatory Visit (HOSPITAL_COMMUNITY): Attending: Student

## 2023-08-29 ENCOUNTER — Encounter (HOSPITAL_COMMUNITY): Payer: Self-pay

## 2023-08-29 ENCOUNTER — Telehealth: Payer: Self-pay | Admitting: Pulmonary Disease

## 2023-08-29 DIAGNOSIS — R051 Acute cough: Secondary | ICD-10-CM

## 2023-08-29 MED ORDER — AZITHROMYCIN 500 MG PO TABS: 500.0000 mg | ORAL_TABLET | Freq: Every day | ORAL | 0 refills | Status: AC

## 2023-08-29 MED ORDER — AZITHROMYCIN 500 MG PO TABS: 500.0000 mg | ORAL_TABLET | Freq: Every day | ORAL | 0 refills | Status: DC

## 2023-08-29 NOTE — Telephone Encounter (Signed)
 Tara Lewis is calling for 2 episodes of excessive coughing that happened the last 2 nights.  She is waking up in the middle of the night coughing excessively.  She denies any fever chills or night sweats.  She denies any change in color in her sputum.  She does report that she eats late at night just prior to bedtime.  There is a component of heartburn and possibly acid reflux and is supposed to start on PPI by her gastroenterologist today.  I advised her on not eating anything 2 hours before bedtime.  And staying upright for at least 30 minutes after p.o. intake.  I agree with PPI.  I will also prescribe her azithromycin for 3 days.  Advised to continue on her current inhalers.  She is pending a CT scan of the chest today at 6 PM.  [name], MD [facility] 08/29/2023 9:52 AM

## 2023-09-03 ENCOUNTER — Encounter: Payer: Self-pay | Admitting: Internal Medicine

## 2023-09-03 NOTE — Telephone Encounter (Signed)
 I have left a message asking the patient to call me back to reschedule her CT

## 2023-09-03 NOTE — Telephone Encounter (Signed)
 I have spoke with Tara Lewis and her CT has been rescheduled on 09/05/23  @7 :00pm at [facility]

## 2023-09-05 ENCOUNTER — Ambulatory Visit (HOSPITAL_COMMUNITY)
Admission: RE | Admit: 2023-09-05 | Discharge: 2023-09-05 | Disposition: A | Discharge status: Home / Self Care | Source: Ambulatory Visit | Attending: Student | Admitting: Student

## 2023-09-05 DIAGNOSIS — R0602 Shortness of breath: Secondary | ICD-10-CM | POA: Insufficient documentation

## 2023-09-10 ENCOUNTER — Telehealth: Payer: Self-pay

## 2023-09-10 NOTE — Telephone Encounter (Signed)
 Pt called stating that the pantoprazole  seems to be helping. Pt states that the pain is not as bad.

## 2023-09-11 ENCOUNTER — Ambulatory Visit: Admitting: Student in an Organized Health Care Education/Training Program

## 2023-09-11 ENCOUNTER — Encounter: Payer: Self-pay | Admitting: Student in an Organized Health Care Education/Training Program

## 2023-09-11 VITALS — BP 110/62 | HR 89 | Temp 97.1°F | Ht 66.0 in | Wt 156.8 lb

## 2023-09-11 DIAGNOSIS — R0602 Shortness of breath: Secondary | ICD-10-CM

## 2023-09-11 DIAGNOSIS — F17200 Nicotine dependence, unspecified, uncomplicated: Secondary | ICD-10-CM

## 2023-09-11 DIAGNOSIS — J9611 Chronic respiratory failure with hypoxia: Secondary | ICD-10-CM | POA: Diagnosis not present

## 2023-09-13 NOTE — Progress Notes (Signed)
 Assessment & Plan:   #Chronic hypoxic respiratory failure (HCC) (Primary) #Shortness of Breath  Presented for the evaluation of shortness of breath following a hospitalization for presumed COPD exacerbation and acute hypoxic respiratory failure. Patient presents without oxygen to clinic today, and has mostly been using O2 therapy with sleep. She trended well on room air during her previous visit. She continues to be compliant with her inhalers (Symbicort  and Spiriva  Respimat) but continues to smoke. Exam today shows decreased air entry but otherwise unremarkable.  Her previous chest CT was notable for scattered groundglass opacities and a repeat chest CT was ordered, which on my review shows resolution of said GGO's. An official read was not available at the time of our interview. FENO was previously normal in clinic. Peak Eosinophil count was at 700 in February of 2025. Asthma/COPD overlap is certainly on the differential and she is pending PFT's. Today, I will re-order the PFT's as well as order overnight pulse oximetry to assess her oxygen levels nocturnally. Should that be normal, we can safely discontinue oxygen therapy.  - Overnight Pulse Oximetry Study; Future - Pulmonary Function Test; Future  #Tobacco Use Disorder  Unfortunately continues to smoke. Today, we discussed smoking cessation again and explored her attitude towards cessation. I had prescribed her cessation aids which she obtained but has not yet used. She continues to contemplate smoking cessation.  Return in about 3 months (around 12/12/2023).  I spent 34 minutes caring for this patient today, including preparing to see the patient, obtaining a medical history , reviewing a separately obtained history, performing a medically appropriate examination and/or evaluation, counseling and educating the patient/family/caregiver, ordering medications, tests, or procedures, documenting clinical information in the electronic health  record, and independently interpreting results (not separately reported/billed) and communicating results to the patient/family/caregiver. 3 minutes utilized counseling the patient regarding smoking cessation.  Vergia Glasgow, MD [facility]   End of visit medications:  No orders of the defined types were placed in this encounter.    Current Outpatient Medications:    albuterol  (PROVENTIL ) (2.5 MG/3ML) 0.083% nebulizer solution, Take 2.5 mg by nebulization every 6 (six) hours as needed., Disp: , Rfl:    albuterol  (VENTOLIN  HFA) 108 (90 Base) MCG/ACT inhaler, Inhale 2 puffs into the lungs every 6 (six) hours as needed for wheezing or shortness of breath., Disp: 6.7 g, Rfl: 0   allopurinol (ZYLOPRIM) 300 MG tablet, Take 300 mg by mouth daily., Disp: , Rfl:    amoxicillin-clavulanate (AUGMENTIN) 875-125 MG tablet, Take 1 tablet by mouth 2 (two) times daily., Disp: , Rfl:    B Complex Vitamins (B COMPLEX-B12) TABS, Take by mouth., Disp: , Rfl:    budesonide -formoterol  (SYMBICORT ) 160-4.5 MCG/ACT inhaler, Inhale 2 puffs into the lungs in the morning and at bedtime., Disp: 120 each, Rfl: 12   colchicine 0.6 MG tablet, , Disp: , Rfl:    cyanocobalamin (VITAMIN B12) 1000 MCG tablet, Take 1 tablet by mouth daily., Disp: , Rfl:    dicyclomine (BENTYL) 10 MG capsule, Take 10 mg by mouth daily., Disp: , Rfl:    DULoxetine  (CYMBALTA ) 30 MG capsule, Take 1 capsule (30 mg total) by mouth daily., Disp: 30 capsule, Rfl: 3   folic acid (FOLVITE) 1 MG tablet, Take 1 mg by mouth daily., Disp: , Rfl:    levothyroxine  (SYNTHROID ) 50 MCG tablet, Take 50 mcg by mouth daily before breakfast. Patient reports she takes 50 mcg daily not 75 mcg, Disp: , Rfl:  lidocaine  (LIDODERM ) 5 %, Place 1 patch onto the skin daily., Disp: , Rfl:    methocarbamol (ROBAXIN) 500 MG tablet, Take 500-1,000 mg by mouth 3 (three) times daily., Disp: , Rfl:    OXYGEN, Inhale 2 L into the lungs at bedtime. PRN during  the day, Disp: , Rfl:    pantoprazole  (PROTONIX ) 40 MG tablet, Take 1 tablet (40 mg total) by mouth 2 (two) times daily before a meal., Disp: 60 tablet, Rfl: 3   Spacer/Aero-Holding Chambers (AEROCHAMBER MV) inhaler, Use as instructed, Disp: 1 each, Rfl: 0   Tiotropium Bromide Monohydrate  (SPIRIVA  RESPIMAT) 2.5 MCG/ACT AERS, Inhale 2 puffs into the lungs daily., Disp: 60 each, Rfl: 11   celecoxib (CELEBREX) 100 MG capsule, Take 100 mg by mouth 2 (two) times daily. (Patient not taking: Reported on 09/11/2023), Disp: , Rfl:    ibuprofen (ADVIL) 200 MG tablet, Take 400 mg by mouth every 8 (eight) hours as needed for mild pain (pain score 1-3). (Patient not taking: Reported on 09/11/2023), Disp: , Rfl:    nicotine  polacrilex (NICOTINE  MINI) 2 MG lozenge, Take 1 lozenge (2 mg total) by mouth every 2 (two) hours as needed for smoking cessation. (Patient not taking: Reported on 09/11/2023), Disp: 72 lozenge, Rfl: 3   Subjective:   PATIENT ID: Tara Lewis GENDER: female DOB: [birth year], MRN: [id number]  Chief Complaint  Patient presents with   Follow-up    DOE. Wheezing. Cough, dry.    HPI  Patient is a pleasant 58 year old female with a past medical history of alcohol use disorder and hypothyroidism who presents today for follow up. I last met with Tara Lewis in March of 2025.  Since our last visit, she called in reporting nocturnal cough and GERD was suspected. She was prescribed a course of antibiotics, with PPI previously prescribed by her gastroenterologist. She was recommended dietary and lifestyle modifications for GERD. Patient is currently only using her oxygen at night and with significant exertion. She has been compliant with her inhalers but has continued to smoke. No new symptoms reported. She's had her repeat CT and is here to discuss results, but had not underwent pulmonary function testing yet.  Initial onset of symptoms prompting her to seek care was around November 2024 when she  experienced weakness and abdominal discomfort and found to have C. difficile and pancreatitis.  She continues to be weak after said admission with the subsequent development of progressive shortness of breath and associated cough.  She was seen by her primary care provider who noted her to be very hypoxic with an SpO2 of 60%.  She was asked to go to the emergency department where she was admitted to [facility] for acute hypoxic respiratory failure secondary to presumed COPD exacerbation.   In the hospital, she was initially on high flow nasal cannula subsequently trended down to 2 L of [place].  She was treated with a course of steroids as well as broad-spectrum antibiotics with improvement.  A CT scan of the chest ruled out PE and was notable for some groundglass opacities as well as right hilar lymph nodes.  Patient was maintained on Symbicort  which was continued on discharge. Spiriva  Respimat was initiated after her last visit in clinic with me.   Patient previously worked as a Interior and spatial designer and reported some respiratory symptoms ongoing at work.  She has a longstanding history of smoking, having smoked between 1 and 1.5 pack/day since the age of 26, with at least 40 pack years of smoking history.  She continues to smoke.  Ancillary information including prior medications, full medical/surgical/family/social histories, and PFTs (when available) are listed below and have been reviewed.   Review of Systems  Constitutional:  Negative for chills, diaphoresis, fever and weight loss.  Respiratory:  Positive for cough, sputum production, shortness of breath and wheezing. Negative for hemoptysis.   Cardiovascular:  Negative for chest pain and leg swelling.     Objective:   Vitals:   09/11/23 1503  BP: 110/62  Pulse: 89  Temp: (!) 97.1 F (36.2 C)  SpO2: 96%  Weight: 156 lb 12.8 oz (71.1 kg)  Height: 5\' 6"  (1.676 m)   96% on RA BMI Readings from Last 3 Encounters:  09/11/23 25.31 kg/m  08/27/23 25.18  kg/m  07/17/23 23.79 kg/m   Wt Readings from Last 3 Encounters:  09/11/23 156 lb 12.8 oz (71.1 kg)  08/27/23 156 lb (70.8 kg)  07/17/23 147 lb 6.4 oz (66.9 kg)    Physical Exam Constitutional:      Appearance: Normal appearance.  Cardiovascular:     Rate and Rhythm: Normal rate and regular rhythm.     Pulses: Normal pulses.     Heart sounds: Normal heart sounds.  Pulmonary:     Effort: Pulmonary effort is normal.     Breath sounds: No wheezing, rhonchi or rales.  Neurological:     General: No focal deficit present.     Mental Status: She is alert and oriented to person, place, and time. Mental status is at baseline.       Ancillary Information    Past Medical History:  Diagnosis Date   Anxiety    Carpal tunnel syndrome    COPD (chronic obstructive pulmonary disease) (HCC)    Depression    Fibromyalgia    GERD (gastroesophageal reflux disease)    Graves' disease    Hypothyroidism      Family History  Problem Relation Age of Onset   Kidney disease Mother    Thyroid  disease Mother    Fibromyalgia Mother    Asthma Mother    Hypertension Father    Heart attack Father        3 heart attacks   Heart disease Father    Thyroid  disease Sister    Fibromyalgia Sister      Past Surgical History:  Procedure Laterality Date   ABDOMINAL HYSTERECTOMY  2000s   BIOPSY  05/09/2023   Procedure: BIOPSY;  Surgeon: Suzette Espy, MD;  Location: [facility];  Service: Endoscopy;;   COLONOSCOPY WITH PROPOFOL  N/A 05/09/2023   Procedure: COLONOSCOPY WITH PROPOFOL ;  Surgeon: Suzette Espy, MD;  Location: [facility];  Service: Endoscopy;  Laterality: N/A;  200pm, asa 4   ESOPHAGOGASTRODUODENOSCOPY (EGD) WITH PROPOFOL  N/A 05/09/2023   Procedure: ESOPHAGOGASTRODUODENOSCOPY (EGD) WITH PROPOFOL ;  Surgeon: Suzette Espy, MD;  Location: [facility];  Service: Endoscopy;  Laterality: N/A;   KNEE SURGERY Right    MALONEY DILATION  05/09/2023   Procedure: MALONEY DILATION;   Surgeon: Suzette Espy, MD;  Location: [facility];  Service: Endoscopy;;   OVARIAN CYST SURGERY      Social History   Socioeconomic History   Marital status: Widowed    Spouse name: Not on file   Number of children: Not on file   Years of education: Not on file   Highest education level: Not on file  Occupational History   Not on file  Tobacco Use   Smoking status: Every Day  Current packs/day: 1.50    Average packs/day: 1.5 packs/day for 40.4 years (60.6 ttl pk-yrs)    Types: Cigarettes    Start date: 1985   Smokeless tobacco: Never   Tobacco comments:    Started smoked at 58 years old    Smoked 1.5 PPD at her heaviest    1.5 PPD - khj 09/11/2023  Vaping Use   Vaping status: Some Days   Substances: Nicotine   Substance and Sexual Activity   Alcohol use: Not Currently    Comment: 3-4 cans of beer/daily   Drug use: No   Sexual activity: Never    Birth control/protection: Surgical    Comment: last time approx october 2017  Other Topics Concern   Not on file  Social History Narrative   Are you right handed or left handed? Right Handed    Are you currently employed ? Yes   What is your current occupation? [employer]    Do you live at home alone? Yes   Who lives with you? Boyfriend stays with her sometimes    What type of home do you live in: 1 story or 2 story? Lives in a one story home        Social Drivers of Health   Financial Resource Strain: High Risk (07/23/2023)   Received from [facility]   Overall Financial Resource Strain (CARDIA)    Difficulty of Paying Living Expenses: Hard  Food Insecurity: No Food Insecurity (07/23/2023)   Received from [facility]   Hunger Vital Sign    Worried About Running Out of Food in the Last Year: Never true    Ran Out of Food in the Last Year: Never true  Transportation Needs: No Transportation Needs (07/23/2023)   Received from [facility] - Transportation    Lack of Transportation (Medical): No     Lack of Transportation (Non-Medical): No  Recent Concern: Transportation Needs - Unmet Transportation Needs (06/29/2023)   [facility]   [facility] - [facility]    Lack of Transportation (Medical): Yes    Lack of Transportation (Non-Medical): No  Physical Activity: Unknown (07/23/2023)   Received from [facility]   Exercise Vital Sign    Days of Exercise per Week: 0 days    Minutes of Exercise per Session: Not on file  Recent Concern: Physical Activity - Insufficiently Active (06/29/2023)   Received from [facility]   Exercise Vital Sign    Days of Exercise per Week: 3 days    Minutes of Exercise per Session: 30 min  Stress: Stress Concern Present (07/23/2023)   Received from [facility] - Occupational Stress Questionnaire    Feeling of Stress : To some extent  Social Connections: Somewhat Isolated (07/23/2023)   Received from [facility]   Social Network    How would you rate your social network (family, work, friends)?: Restricted participation with some degree of social isolation  Intimate Partner Violence: Not At Risk (07/23/2023)   Received from [facility]   HITS    Over the last 12 months how often did your partner physically hurt you?: Never    Over the last 12 months how often did your partner insult you or talk down to you?: Never    Over the last 12 months how often did your partner threaten you with physical harm?: Never    Over the last 12 months how often did your partner scream  or curse at you?: Never     Allergies  Allergen Reactions   Other Other (See Comments)    Skin tears easily    Makes legs hurt   Tape Other (See Comments)    Skin tears easily   Codeine Other (See Comments)    Patient reports stomach pain.   Statins Other (See Comments)    Makes legs hurt     CBC    Component Value Date/Time   WBC 10.0 06/12/2023 1038   WBC 8.1 06/12/2016 0727   RBC 5.05 06/12/2023 1038   RBC  4.74 06/12/2016 0727   HGB 14.9 06/12/2023 1038   HCT 46.5 06/12/2023 1038   PLT 220 06/12/2023 1038   MCV 92 06/12/2023 1038   MCH 29.5 06/12/2023 1038   MCH 30.8 06/12/2016 0727   MCHC 32.0 06/12/2023 1038   MCHC 33.6 06/12/2016 0727   RDW 12.9 06/12/2023 1038   LYMPHSABS 2.1 06/12/2023 1038   MONOABS 648 06/12/2016 0727   EOSABS 0.7 (H) 06/12/2023 1038   BASOSABS 0.1 06/12/2023 1038    Pulmonary Functions Testing Results:     No data to display          Outpatient Medications Prior to Visit  Medication Sig Dispense Refill   albuterol  (PROVENTIL ) (2.5 MG/3ML) 0.083% nebulizer solution Take 2.5 mg by nebulization every 6 (six) hours as needed.     albuterol  (VENTOLIN  HFA) 108 (90 Base) MCG/ACT inhaler Inhale 2 puffs into the lungs every 6 (six) hours as needed for wheezing or shortness of breath. 6.7 g 0   allopurinol (ZYLOPRIM) 300 MG tablet Take 300 mg by mouth daily.     amoxicillin-clavulanate (AUGMENTIN) 875-125 MG tablet Take 1 tablet by mouth 2 (two) times daily.     B Complex Vitamins (B COMPLEX-B12) TABS Take by mouth.     budesonide -formoterol  (SYMBICORT ) 160-4.5 MCG/ACT inhaler Inhale 2 puffs into the lungs in the morning and at bedtime. 120 each 12   colchicine 0.6 MG tablet      cyanocobalamin (VITAMIN B12) 1000 MCG tablet Take 1 tablet by mouth daily.     dicyclomine (BENTYL) 10 MG capsule Take 10 mg by mouth daily.     DULoxetine  (CYMBALTA ) 30 MG capsule Take 1 capsule (30 mg total) by mouth daily. 30 capsule 3   folic acid (FOLVITE) 1 MG tablet Take 1 mg by mouth daily.     levothyroxine  (SYNTHROID ) 50 MCG tablet Take 50 mcg by mouth daily before breakfast. Patient reports she takes 50 mcg daily not 75 mcg     lidocaine  (LIDODERM ) 5 % Place 1 patch onto the skin daily.     methocarbamol (ROBAXIN) 500 MG tablet Take 500-1,000 mg by mouth 3 (three) times daily.     OXYGEN Inhale 2 L into the lungs at bedtime. PRN during the day     pantoprazole  (PROTONIX ) 40 MG  tablet Take 1 tablet (40 mg total) by mouth 2 (two) times daily before a meal. 60 tablet 3   Spacer/Aero-Holding Chambers (AEROCHAMBER MV) inhaler Use as instructed 1 each 0   Tiotropium Bromide Monohydrate  (SPIRIVA  RESPIMAT) 2.5 MCG/ACT AERS Inhale 2 puffs into the lungs daily. 60 each 11   celecoxib (CELEBREX) 100 MG capsule Take 100 mg by mouth 2 (two) times daily. (Patient not taking: Reported on 09/11/2023)     ibuprofen (ADVIL) 200 MG tablet Take 400 mg by mouth every 8 (eight) hours as needed for mild pain (pain score 1-3). (Patient not taking:  Reported on 09/11/2023)     nicotine  polacrilex (NICOTINE  MINI) 2 MG lozenge Take 1 lozenge (2 mg total) by mouth every 2 (two) hours as needed for smoking cessation. (Patient not taking: Reported on 09/11/2023) 72 lozenge 3   No facility-administered medications prior to visit.

## 2023-09-17 ENCOUNTER — Ambulatory Visit: Admitting: Internal Medicine

## 2023-09-18 ENCOUNTER — Ambulatory Visit: Admitting: Internal Medicine

## 2023-09-18 ENCOUNTER — Encounter: Payer: Self-pay | Admitting: Student in an Organized Health Care Education/Training Program

## 2023-09-18 ENCOUNTER — Encounter: Payer: Self-pay | Admitting: Internal Medicine

## 2023-09-18 VITALS — BP 106/73 | HR 93 | Temp 97.8°F | Ht 66.0 in | Wt 157.8 lb

## 2023-09-18 DIAGNOSIS — F1011 Alcohol abuse, in remission: Secondary | ICD-10-CM

## 2023-09-18 DIAGNOSIS — R1013 Epigastric pain: Secondary | ICD-10-CM

## 2023-09-18 DIAGNOSIS — F17210 Nicotine dependence, cigarettes, uncomplicated: Secondary | ICD-10-CM

## 2023-09-18 DIAGNOSIS — I2510 Atherosclerotic heart disease of native coronary artery without angina pectoris: Secondary | ICD-10-CM

## 2023-09-18 NOTE — Patient Instructions (Signed)
 It was good to see you again today!  Continue to avoid all NSAIDs including Celebrex and ibuprofen  For now, continue pantoprazole  Protonix  40 mg twice daily best taken 30 minutes for breakfast and supper  Since your pain is steadily improving, no further evaluation is needed at this time  As discussed, your chest CT recently showed advanced coronary artery disease based on calcium deposits.  This may or may not be clinically significant.  I do recommend you see your PCP and discuss it.  My recommendation would be to at least get a stress test.  You will be due for a screening colonoscopy in 10 years  Will plan to see you back in 3 months  If, in the interim, you are not steadily improving, please let me know

## 2023-09-18 NOTE — Progress Notes (Signed)
 Primary Care Physician:  Sherma Diver, NP Primary Gastroenterologist:  Dr. Riley Cheadle  Pre-Procedure History & Physical: HPI:  Tara Lewis is a 58 y.o. female here for follow-up of abdominal pain hospitalized earlier this year with enterocolitis on CT/gastroenteritis.  She recovered.  Dysphagia evaluated with EGD ; normal esophagus.  Responded to empiric passage of Maloney dilator.  Changes consistent with portal hypertensive gastropathy.  Biopsies negative for H. pylori.  No hepatosplenomegaly or evidence portal hypertension on prior imaging.  She stopped drinking in March 05, 2023.  Incidentally, she tells me that her sister got a liver transplant for EtOH related cirrhosis 1 week ago.  As far as her abdominal pain is concerned and steadily improving she stopped taking Celebrex and ibuprofen continues on twice daily PPI therapy.  She is in the process of undergoing spine ablation therapy and apparent simulation.  Bowel function is good intake is good  She had a coughing spell after she saw me a few days later saw pulmonary chest CT did not show any pulmonary infiltrates but advanced coronary artery disease.  She ask about that as she went on MyChart.  Nothing was set or done about that  She does not have any cardiac symptoms.  As far as liver is concerned she did get a elastography exam limited quality K PA 3.0.  Past Medical History:  Diagnosis Date   Anxiety    Carpal tunnel syndrome    COPD (chronic obstructive pulmonary disease) (HCC)    Depression    Fibromyalgia    GERD (gastroesophageal reflux disease)    Graves' disease    Hypothyroidism     Past Surgical History:  Procedure Laterality Date   ABDOMINAL HYSTERECTOMY  2000s   BIOPSY  05/09/2023   Procedure: BIOPSY;  Surgeon: Suzette Espy, MD;  [facility];  Service: Endoscopy;;   COLONOSCOPY WITH PROPOFOL  N/A 05/09/2023   Procedure: COLONOSCOPY WITH PROPOFOL ;  Surgeon: Suzette Espy, MD;  [facility]: [facility];  Service: Endoscopy;  Laterality: N/A;  200pm, asa 4   ESOPHAGOGASTRODUODENOSCOPY (EGD) WITH PROPOFOL  N/A 05/09/2023   Procedure: ESOPHAGOGASTRODUODENOSCOPY (EGD) WITH PROPOFOL ;  Surgeon: Suzette Espy, MD;  [facility];  Service: Endoscopy;  Laterality: N/A;   KNEE SURGERY Right    MALONEY DILATION  05/09/2023   Procedure: MALONEY DILATION;  Surgeon: Suzette Espy, MD;  [facility]: [facility];  Service: Endoscopy;;   OVARIAN CYST SURGERY      Prior to Admission medications   Medication Sig Start Date End Date Taking? Authorizing Provider  albuterol  (PROVENTIL ) (2.5 MG/3ML) 0.083% nebulizer solution Take 2.5 mg by nebulization every 6 (six) hours as needed. 05/16/23  Yes [provider]  albuterol  (VENTOLIN  HFA) 108 (90 Base) MCG/ACT inhaler Inhale 2 puffs into the lungs every 6 (six) hours as needed for wheezing or shortness of breath. 07/21/20  Yes Luane Rumps, PA-C  allopurinol (ZYLOPRIM) 300 MG tablet Take 300 mg by mouth daily.   Yes [provider]  B Complex Vitamins (B COMPLEX-B12) TABS Take by mouth.   Yes [provider]  budesonide -formoterol  (SYMBICORT ) 160-4.5 MCG/ACT inhaler Inhale 2 puffs into the lungs in the morning and at bedtime. 07/18/23  Yes Dgayli, Berneta Brightly, MD  colchicine 0.6 MG tablet  10/06/22  Yes [provider]  cyanocobalamin (VITAMIN B12) 1000 MCG tablet Take 1 tablet by mouth daily. 03/15/23 03/14/24 Yes [provider]  cyclobenzaprine (FLEXERIL) 10 MG tablet Take 10 mg by  mouth 3 (three) times daily as needed. 09/17/23 12/16/23 Yes [provider]  dicyclomine (BENTYL) 10 MG capsule Take 10 mg by mouth daily. 10/06/21  Yes [provider]  DULoxetine  (CYMBALTA ) 30 MG capsule Take 1 capsule (30 mg total) by mouth daily. 02/06/23  Yes Patel, Donika K, DO  folic acid (FOLVITE) 1 MG tablet Take 1 mg by mouth daily.   Yes [provider]  levothyroxine  (SYNTHROID ) 50 MCG tablet  Take 50 mcg by mouth daily before breakfast. Patient reports she takes 50 mcg daily not 75 mcg   Yes [provider]  lidocaine  (LIDODERM ) 5 % Place 1 patch onto the skin daily. 08/03/23  Yes [provider]  nicotine  polacrilex (NICOTINE  MINI) 2 MG lozenge Take 1 lozenge (2 mg total) by mouth every 2 (two) hours as needed for smoking cessation. 07/17/23 10/15/23 Yes Dgayli, Berneta Brightly, MD  OXYGEN Inhale 2 L into the lungs at bedtime. PRN during the day   Yes [provider]  pantoprazole  (PROTONIX ) 40 MG tablet Take 1 tablet (40 mg total) by mouth 2 (two) times daily before a meal. 08/28/23  Yes Ammanda Dobbins, Windsor Hatcher, MD  Spacer/Aero-Holding Chambers (AEROCHAMBER MV) inhaler Use as instructed 07/18/23  Yes Dgayli, Berneta Brightly, MD  Tiotropium Bromide Monohydrate  (SPIRIVA  RESPIMAT) 2.5 MCG/ACT AERS Inhale 2 puffs into the lungs daily. 07/17/23  Yes Vergia Glasgow, MD    Allergies as of 09/18/2023 - Review Complete 09/18/2023  Allergen Reaction Noted   Other Other (See Comments) 05/17/2017   Tape Other (See Comments) 07/12/2023   Codeine Other (See Comments) 05/11/2016   Statins Other (See Comments) 05/17/2017    Family History  Problem Relation Age of Onset   Kidney disease Mother    Thyroid  disease Mother    Fibromyalgia Mother    Asthma Mother    Hypertension Father    Heart attack Father        3 heart attacks   Heart disease Father    Thyroid  disease Sister    Fibromyalgia Sister     Social History   Socioeconomic History   Marital status: Widowed    Spouse name: Not on file   Number of children: Not on file   Years of education: Not on file   Highest education level: Not on file  Occupational History   Not on file  Tobacco Use   Smoking status: Every Day    Current packs/day: 1.50    Average packs/day: 1.5 packs/day for 40.4 years (60.6 ttl pk-yrs)    Types: Cigarettes    Start date: 42   Smokeless tobacco: Never   Tobacco comments:    Started smoked at 58  years old    Smoked 1.5 PPD at her heaviest    1.5 PPD - khj 09/11/2023  Vaping Use   Vaping status: Some Days   Substances: Nicotine   Substance and Sexual Activity   Alcohol use: Not Currently    Comment: 3-4 cans of beer/daily   Drug use: No   Sexual activity: Never    Birth control/protection: Surgical    Comment: last time approx october 2017  Other Topics Concern   Not on file  Social History Narrative   Are you right handed or left handed? Right Handed    Are you currently employed ? Yes   What is your current occupation? [employer]    Do you live at home alone? Yes   Who lives with you? Boyfriend stays with her sometimes  What type of home do you live in: 1 story or 2 story? Lives in a one story home        Social Drivers of Health   Financial Resource Strain: High Risk (07/23/2023)   Received from [facility]   Overall Financial Resource Strain (CARDIA)    Difficulty of Paying Living Expenses: Hard  Food Insecurity: No Food Insecurity (07/23/2023)   Received from [facility]   Hunger Vital Sign    Worried About Running Out of Food in the Last Year: Never true    Ran Out of Food in the Last Year: Never true  Transportation Needs: No Transportation Needs (07/23/2023)   Received from [facility] - Transportation    Lack of Transportation (Medical): No    Lack of Transportation (Non-Medical): No  Recent Concern: Transportation Needs - Unmet Transportation Needs (06/29/2023)   Received from [facility]   [facility] - Transportation    Lack of Transportation (Medical): Yes    Lack of Transportation (Non-Medical): No  Physical Activity: Unknown (07/23/2023)   Received from [facility]   Exercise Vital Sign    Days of Exercise per Week: 0 days    Minutes of Exercise per Session: Not on file  Recent Concern: Physical Activity - Insufficiently Active (06/29/2023)   Received from [facility]   Exercise Vital Sign    Days of Exercise per Week: 3  days    Minutes of Exercise per Session: 30 min  Stress: Stress Concern Present (07/23/2023)   Received from [facility] - Occupational Stress Questionnaire    Feeling of Stress : To some extent  Social Connections: Somewhat Isolated (07/23/2023)   Received from [facility]   Social Network    How would you rate your social network (family, work, friends)?: Restricted participation with some degree of social isolation  Intimate Partner Violence: Not At Risk (07/23/2023)   Received from [facility]   HITS    Over the last 12 months how often did your partner physically hurt you?: Never    Over the last 12 months how often did your partner insult you or talk down to you?: Never    Over the last 12 months how often did your partner threaten you with physical harm?: Never    Over the last 12 months how often did your partner scream or curse at you?: Never    Review of Systems: See HPI, otherwise negative ROS  Physical Exam: BP 106/73 (BP Location: Right Arm, Patient Position: Sitting, Cuff Size: Normal)   Pulse 93   Temp 97.8 F (36.6 C) (Oral)   Ht 5\' 6"  (1.676 m)   Wt 157 lb 12.8 oz (71.6 kg)   SpO2 93%   BMI 25.47 kg/m  General:   Alert,  Well-developed, well-nourished, pleasant and cooperative in NAD Neck:  Supple; no masses or thyromegaly. No significant cervical adenopathy. Lungs:  Clear throughout to auscultation.   No wheezes, crackles, or rhonchi. No acute distress. Heart:  Regular rate and rhythm; no murmurs, clicks, rubs,  or gallops. Abdomen: Non-distended, normal bowel sounds.  Soft and nontender without appreciable mass or hepatosplenomegaly.   Impression/Plan: 58 year old lady with EtOH use disorder - no indigestion since November;   When she was being evaluated for upper abdominal pain which  has steadily improved since coming off the Celebrex and ibuprofen use simultaneously in the process of undergoing spine  ablation/simulation.  EGD findings  are reassuring although changes consistent with mild portal gastropathy need to be kept in mind.  No evidence of portal hypertensive tension or advanced chronic liver disease otherwise.  Elastography low K PA but quality limited.    Abdominal pain steadily improving at this point no further evaluation needed for that she is to continue twice daily Protonix .  Not mentioned above, colonoscopy was good earlier this year-10-year reading schedule.  Recommendations:  Commended on EtOH cessation.  No NSAIDs continue twice daily PPI for now  See PCP about calcified coronary arteries.  Consider stress test as they deem appropriate  Repeat colonoscopy 10 years  Office visit here in 3 months  Would consider additional noninvasive fibrosis testing at some point in the near future.      Notice: This dictation was prepared with Dragon dictation along with smaller phrase technology. Any transcriptional errors that result from this process are unintentional and may not be corrected upon review.

## 2023-09-24 ENCOUNTER — Ambulatory Visit: Payer: Self-pay | Admitting: Student in an Organized Health Care Education/Training Program

## 2023-10-01 ENCOUNTER — Telehealth: Payer: Self-pay | Admitting: Internal Medicine

## 2023-10-01 NOTE — Telephone Encounter (Signed)
 Patient calling in to see if she needs to make her appt after her stress test. Please advise

## 2023-10-08 ENCOUNTER — Ambulatory Visit: Payer: Medicaid Other | Admitting: Internal Medicine

## 2023-10-26 ENCOUNTER — Telehealth: Payer: Self-pay

## 2023-10-26 NOTE — Telephone Encounter (Signed)
 Copied from CRM [id number]. Topic: Clinical - Lab/Test Results >> Oct 26, 2023 11:52 AM Benton O wrote: Reason for RMF:ejupzwu is calling because she  got a message from [facility] i could call to get the results from my  pulse monitor test and they said i could get my results  Patient did say you can send results in mychart if they are understandable  [phone]

## 2023-10-29 NOTE — Telephone Encounter (Signed)
 We have received the ONO results from [facility]. I have placed them in Dr. Clydene folder to review

## 2023-10-30 ENCOUNTER — Ambulatory Visit
Admission: RE | Admit: 2023-10-30 | Discharge: 2023-10-30 | Disposition: A | Discharge status: Home / Self Care | Source: Ambulatory Visit | Attending: Internal Medicine | Admitting: Internal Medicine

## 2023-10-30 ENCOUNTER — Other Ambulatory Visit: Payer: Self-pay | Admitting: Internal Medicine

## 2023-10-30 DIAGNOSIS — Z1231 Encounter for screening mammogram for malignant neoplasm of breast: Secondary | ICD-10-CM

## 2023-10-31 ENCOUNTER — Ambulatory Visit: Admitting: Student in an Organized Health Care Education/Training Program

## 2023-11-01 ENCOUNTER — Telehealth: Payer: Self-pay

## 2023-11-01 NOTE — Telephone Encounter (Signed)
 I notified the patient of her ONO results. While I was on the phone she told me she was going to go for a walk today and her O2 dropped to 77%. She was at 87% then 85% when I was on the phone with her. She said she did see her PCP yesterday for her breathing and they did lab work that showed her WBC was elevated and gave her doxycyline. I did have her put on her O2 at 2L which did not bring her O2 up above 86%. She then went up to 3L and her O2 came up to 92%.   Dr. Isadora is out of the office. Per Dr. Isaiah- she need to be evaluated in the ED.  I notified the patient. I did tell her she will need to stay on O2 and make sure her O2 stays above 88%. She said she will get her Boyfried to take her  to the ED.   Dr. Isadora- just an FYI.  Nothing further needed.

## 2023-11-01 NOTE — Telephone Encounter (Signed)
 ONO reviewed by Dr.Dgayli - Low SpO2 80%.Continue oxygen at night at 2L.

## 2023-11-05 ENCOUNTER — Encounter: Payer: Self-pay | Admitting: Internal Medicine

## 2023-11-07 ENCOUNTER — Ambulatory Visit: Attending: Nurse Practitioner | Admitting: Nurse Practitioner

## 2023-11-07 ENCOUNTER — Encounter: Payer: Self-pay | Admitting: Nurse Practitioner

## 2023-11-07 VITALS — BP 118/60 | HR 100 | Ht 66.0 in | Wt 158.4 lb

## 2023-11-07 DIAGNOSIS — R0602 Shortness of breath: Secondary | ICD-10-CM | POA: Diagnosis present

## 2023-11-07 DIAGNOSIS — J449 Chronic obstructive pulmonary disease, unspecified: Secondary | ICD-10-CM | POA: Insufficient documentation

## 2023-11-07 DIAGNOSIS — R002 Palpitations: Secondary | ICD-10-CM | POA: Diagnosis not present

## 2023-11-07 NOTE — Progress Notes (Signed)
 Cardiology Office Note   Date:  11/07/2023  ID:  Tara Lewis, DOB [birth year], MRN [id number] PCP: Rosan Jacquline NOVAK, NP  [facility] Providers Cardiologist:  Diannah SHAUNNA Maywood, MD     History of Present Illness Tara Lewis is a 58 y.o. female with a PMH of Graves' disease, s/p RAI ablation -> hypothyroidism, GERD, COPD, fibromyalgia, anxiety, depression, tobacco abuse, who presents today for follow-up.  Last seen by Dr. Mallipeddi on April 06, 2023 for evaluation of hypotension.  It was noted patient had decreased p.o. intake for 3 days prior to hospitalization at [facility] in November 2024.  Also noted to be consuming alcohol every day, noted abdominal pain.  Was feeling dizzy with getting up and walking, BP noted low, SBP around 80s.  Hospital admission November 2024 for alcohol induced acute pancreatitis/colitis.  Blood pressure improved, denied any chest pain.  Continue to note feeling dizzy but overall improved.  Denied any syncope.  Noted to be sober for 30 days at the time.  Monitor was obtained-see results below.   She is here for follow-up today with her husband.  Overall doing well from a cardiac perspective.  Does tell me that prior to her hospital/ED visit at [facility] on July 4 she, she was having no appetite, no energy running a fever, and having cold chills per husband's report.  She shows me chest x-ray report that was negative for anything acute.  Overall lab work unremarkable.  She currently wears 3 L of oxygen continuously-previously she had been wearing only 2 L oxygen at night or as needed.  Does follow pulmonologist regularly.  Says her increased oxygen demand began earlier this year in March 2025 with previous hospital stay for double pneumonia, this is where her oxygen therapy came into play.  Overall breathing is stable.  Does have some moments where she will wake up with gasping and coughs up phlegm.  Says this has cleared up, currently taking Mucinex per  husband's report.  Overall feeling well today. Denies any chest pain, worsening shortness of breath, recent palpitations, syncope, presyncope, dizziness, orthopnea, PND, swelling or significant weight changes, acute bleeding, or claudication.  Tells me she had a stress test with [facility] last month that was negative.  I do not have these results as of yet.  She is currently being followed by her PCP at [facility] internal for her thyroid  disease. Does notice some palpitations at times, not recently though.   ROS: Negative. See HPI.   Studies Reviewed  EKG:  EKG Interpretation Date/Time:  Wednesday November 07 2023 09:30:47 EDT Ventricular Rate:  100 PR Interval:  204 QRS Duration:  76 QT Interval:  332 QTC Calculation: 428 R Axis:   85  Text Interpretation: Normal sinus rhythm Nonspecific ST and T wave abnormality When compared with ECG of 06-Apr-2023 08:53, Nonspecific T wave abnormality now evident in Inferior leads Nonspecific T wave abnormality now evident in Anterolateral leads Confirmed by Miriam Norris [phone]) on 11/07/2023 9:34:11 AM   Cardiac monitor 03/2024:    Patch wear time was for 13 days and 23 hours.   Normal sinus rhythm predominantly ranging from 65 to 211 bpm with an average HR 90 bpm.   1 run of SVT lasting 4 beats, 1 run of NSVT lasting 5 beats.  No AV block or pauses.   <1% PAC burden and <1% PVC burden.   Symptoms correlated with normal sinus rhythm, 89 to 99 bpm.   Vascular US  lower extremity venous reflux right  04/2023:  Summary:  Right:  - No evidence of deep vein thrombosis from the common femoral through the  popliteal veins.  - No evidence of superficial venous thrombosis.  - The deep venous system is not competent.  - The great saphenous vein is not competent (proximal and mid thigh only).  -The small saphenous vein is competent.    *See table(s) above for measurements and observations.   Physical Exam VS:  BP 118/60   Pulse 100   Ht 5' 6 (1.676 m)   Wt 158 lb  6.4 oz (71.8 kg)   SpO2 98% Comment: 3 litters  BMI 25.57 kg/m        Wt Readings from Last 3 Encounters:  11/07/23 158 lb 6.4 oz (71.8 kg)  09/18/23 157 lb 12.8 oz (71.6 kg)  09/11/23 156 lb 12.8 oz (71.1 kg)    GEN: Well nourished, well developed in no acute distress NECK: No JVD; No carotid bruits CARDIAC: S1/S2, RRR, no murmurs, rubs, gallops RESPIRATORY:  Clear to auscultation without rales, wheezing or rhonchi  ABDOMEN: Soft, non-tender, non-distended EXTREMITIES:  No edema; No deformity   ASSESSMENT AND PLAN  Palpitations Does not notice any recently.  This is usually associated after using her albuterol  inhaler.  I recommended her pulmonologist switch her albuterol  inhaler to levalbuterol.  Want to avoid beta-blockers due to her COPD status.  If heart rate control needed in the future-would recommend diltiazem.  COPD, shortness of breath Currently wearing 3 L of O2.  Recommended medication changes noted above to be made by her pulmonologist.  Care and ED precautions discussed.  Continue follow-up with pulmonology.  If no improvement in her breathing in 3 to 4 months-plan to arrange echocardiogram for further evaluation.  Will request records of her stress test from [facility] earlier this year.   Dispo: Follow-up with me/[facility] in 3 to 4 months or sooner any changes.  Signed, Almarie Crate, NP

## 2023-11-07 NOTE — Patient Instructions (Addendum)
 Medication Instructions:  Your physician recommends that you continue on your current medications as directed. Please refer to the Current Medication list given to you today. The medicine I recommend her pulmonologist changes is to switch her albuterol  to levalbuterol.   Labwork: None   Testing/Procedures: None   Follow-Up: Your physician recommends that you schedule a follow-up appointment in: 3-4 Months   Any Other Special Instructions Will Be Listed Below (If Applicable).  If you need a refill on your cardiac medications before your next appointment, please call your pharmacy.

## 2023-11-08 ENCOUNTER — Encounter: Payer: Self-pay | Admitting: Student in an Organized Health Care Education/Training Program

## 2023-11-08 ENCOUNTER — Encounter: Payer: Self-pay | Admitting: Internal Medicine

## 2023-11-08 DIAGNOSIS — J9611 Chronic respiratory failure with hypoxia: Secondary | ICD-10-CM

## 2023-11-13 MED ORDER — XOPENEX HFA 45 MCG/ACT IN AERO: 2.0000 | INHALATION_SPRAY | Freq: Four times a day (QID) | RESPIRATORY_TRACT | 12 refills | Status: AC | PRN

## 2023-11-14 NOTE — Telephone Encounter (Signed)
 ONO reviewed by Dr.Dgayli - Low SpO2 80%.Continue oxygen at night at 2L.    Patient was notified by Evalene of the ONO results

## 2023-11-26 ENCOUNTER — Encounter: Payer: Self-pay | Admitting: Student in an Organized Health Care Education/Training Program

## 2023-11-30 ENCOUNTER — Other Ambulatory Visit: Payer: Self-pay | Admitting: Medical Genetics

## 2023-12-04 ENCOUNTER — Ambulatory Visit: Admitting: Internal Medicine

## 2023-12-04 ENCOUNTER — Encounter: Payer: Self-pay | Admitting: Internal Medicine

## 2023-12-04 VITALS — BP 104/70 | HR 106 | Temp 98.7°F | Ht 66.0 in | Wt 165.2 lb

## 2023-12-04 DIAGNOSIS — K852 Alcohol induced acute pancreatitis without necrosis or infection: Secondary | ICD-10-CM

## 2023-12-04 DIAGNOSIS — F1011 Alcohol abuse, in remission: Secondary | ICD-10-CM

## 2023-12-04 DIAGNOSIS — K7460 Unspecified cirrhosis of liver: Secondary | ICD-10-CM

## 2023-12-04 DIAGNOSIS — K219 Gastro-esophageal reflux disease without esophagitis: Secondary | ICD-10-CM

## 2023-12-04 DIAGNOSIS — R1013 Epigastric pain: Secondary | ICD-10-CM

## 2023-12-04 NOTE — Progress Notes (Unsigned)
 Primary Care Physician:  Rosan Jacquline NOVAK, NP Primary Gastroenterologist:  Dr. Shaaron  Pre-Procedure History & Physical: HPI:  Tara Lewis is a 58 y.o. 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  We saw her when she presented with an enterocolitis.  No H. pylori on biopsies.  No hepatosplenomegaly or portal hypertension on imaging.  No alcohol since November 2024.  He stopped taking Celebrex and ibuprofen.  Takes Tylenol does not exceed label instructions.  Elastography K PA low at 3.0 but poor quality study. Parikh dilation of her esophagus for dysphagia has been associated with resolution of those symptoms.  She continues on twice daily PPI therapy.  Normal ileocolonoscopy; needs a average rescreening in 10 years.  Past Medical History:  Diagnosis Date   Anxiety    Carpal tunnel syndrome    COPD (chronic obstructive pulmonary disease) (HCC)    Depression    Fibromyalgia    GERD (gastroesophageal reflux disease)    Graves' disease    Hypothyroidism     Past Surgical History:  Procedure Laterality Date   ABDOMINAL HYSTERECTOMY  2000s   BIOPSY  05/09/2023   Procedure: BIOPSY;  Surgeon: Shaaron Lamar HERO, MD;  [facility];  Service: Endoscopy;;   COLONOSCOPY WITH PROPOFOL  N/A 05/09/2023   Procedure: COLONOSCOPY WITH PROPOFOL ;  Surgeon: Shaaron Lamar HERO, MD;  [facility]: [facility];  Service: Endoscopy;  Laterality: N/A;  200pm, asa 4   ESOPHAGOGASTRODUODENOSCOPY (EGD) WITH PROPOFOL  N/A 05/09/2023   Procedure: ESOPHAGOGASTRODUODENOSCOPY (EGD) WITH PROPOFOL ;  Surgeon: Shaaron Lamar HERO, MD;  [facility];  Service: Endoscopy;  Laterality: N/A;   KNEE SURGERY Right    MALONEY DILATION  05/09/2023   Procedure: MALONEY DILATION;  Surgeon: Shaaron Lamar HERO, MD;  [facility]: [facility];  Service: Endoscopy;;   OVARIAN CYST SURGERY      Prior to Admission medications   Medication Sig Start Date End Date Taking?  Authorizing Provider  albuterol  (PROVENTIL ) (2.5 MG/3ML) 0.083% nebulizer solution Take 2.5 mg by nebulization every 6 (six) hours as needed. 05/16/23  Yes [provider]  albuterol  (VENTOLIN  HFA) 108 (90 Base) MCG/ACT inhaler Inhale 2 puffs into the lungs every 6 (six) hours as needed for wheezing or shortness of breath. 07/21/20  Yes Comer Kirsch, PA-C  allopurinol (ZYLOPRIM) 300 MG tablet Take 300 mg by mouth daily.   Yes [provider]  B Complex Vitamins (B COMPLEX-B12) TABS Take by mouth.   Yes [provider]  budesonide -formoterol  (SYMBICORT ) 160-4.5 MCG/ACT inhaler Inhale 2 puffs into the lungs in the morning and at bedtime. 07/18/23  Yes Dgayli, Belva, MD  cyanocobalamin (VITAMIN B12) 1000 MCG tablet Take 1 tablet by mouth daily. 03/15/23 03/14/24 Yes [provider]  cyclobenzaprine (FLEXERIL) 10 MG tablet Take 10 mg by mouth 3 (three) times daily as needed. 09/17/23 12/16/23 Yes [provider]  dicyclomine (BENTYL) 10 MG capsule Take 10 mg by mouth daily. Patient taking differently: Take 10 mg by mouth daily as needed. 10/06/21  Yes [provider]  DULoxetine  (CYMBALTA ) 30 MG capsule Take 1 capsule (30 mg total) by mouth daily. Patient taking differently: Take 60 mg by mouth daily. 02/06/23  Yes Patel, Donika K, DO  folic acid (FOLVITE) 1 MG tablet Take 1 mg by mouth daily.   Yes [provider]  levalbuterol  (XOPENEX  HFA) 45 MCG/ACT inhaler Inhale 2 puffs into the lungs every 6 (six) hours as needed for wheezing  or shortness of breath. 11/13/23  Yes Dgayli, Belva, MD  levothyroxine  (SYNTHROID ) 50 MCG tablet Take 50 mcg by mouth daily before breakfast. Patient reports she takes 50 mcg daily not 75 mcg   Yes [provider]  lidocaine  (LIDODERM ) 5 % Place 1 patch onto the skin daily. 08/03/23  Yes [provider]  OXYGEN Inhale 2 L into the lungs at bedtime. PRN during the day   Yes [provider]   pantoprazole  (PROTONIX ) 40 MG tablet Take 1 tablet (40 mg total) by mouth 2 (two) times daily before a meal. 08/28/23  Yes Azha Constantin, Lamar HERO, MD  Spacer/Aero-Holding Chambers (AEROCHAMBER MV) inhaler Use as instructed 07/18/23  Yes Dgayli, Belva, MD  Tiotropium Bromide Monohydrate  (SPIRIVA  RESPIMAT) 2.5 MCG/ACT AERS Inhale 2 puffs into the lungs daily. 07/17/23  Yes Isadora Belva, MD    Allergies as of 12/04/2023 - Review Complete 12/04/2023  Allergen Reaction Noted   Other Other (See Comments) 05/17/2017   Tape Other (See Comments) 07/12/2023   Codeine Other (See Comments) 05/11/2016   Statins Other (See Comments) 05/17/2017    Family History  Problem Relation Age of Onset   Kidney disease Mother    Thyroid  disease Mother    Fibromyalgia Mother    Asthma Mother    Hypertension Father    Heart attack Father        3 heart attacks   Heart disease Father    Thyroid  disease Sister    Fibromyalgia Sister     Social History   Socioeconomic History   Marital status: Widowed    Spouse name: Not on file   Number of children: Not on file   Years of education: Not on file   Highest education level: Not on file  Occupational History   Not on file  Tobacco Use   Smoking status: Every Day    Current packs/day: 1.50    Average packs/day: 1.5 packs/day for 40.6 years (60.9 ttl pk-yrs)    Types: Cigarettes    Start date: 1985   Smokeless tobacco: Never   Tobacco comments:    Started smoked at 58 years old    Smoked 1.5 PPD at her heaviest    1.5 PPD - khj 09/11/2023  Vaping Use   Vaping status: Some Days   Substances: Nicotine   Substance and Sexual Activity   Alcohol use: Not Currently    Comment: 3-4 cans of beer/daily   Drug use: No   Sexual activity: Never    Birth control/protection: Surgical    Comment: last time approx october 2017  Other Topics Concern   Not on file  Social History Narrative   Are you right handed or left handed? Right Handed    Are you currently  employed ? Yes   What is your current occupation? [employer]    Do you live at home alone? Yes   Who lives with you? Boyfriend stays with her sometimes    What type of home do you live in: 1 story or 2 story? Lives in a one story home        Social Drivers of Health   Financial Resource Strain: Low Risk  (11/03/2023)   Received from [facility]   Overall Financial Resource Strain (CARDIA)    How hard is it for you to pay for the very basics like food, housing, medical care, and heating?: Not very hard  Food Insecurity: No Food Insecurity (11/03/2023)   [facility] from [facility]  Care   Hunger Vital Sign    Within the past 12 months, you worried that your food would run out before you got the money to buy more.: Never true    Within the past 12 months, the food you bought just didn't last and you didn't have money to get more.: Never true  Transportation Needs: No Transportation Needs (11/03/2023)   Received from [facility] - Transportation    Lack of Transportation (Medical): No    Lack of Transportation (Non-Medical): No  Physical Activity: Inactive (11/03/2023)   Received from [facility]   Exercise Vital Sign    On average, how many days per week do you engage in moderate to strenuous exercise (like a brisk walk)?: 0 days    On average, how many minutes do you engage in exercise at this level?: 0 min  Stress: Stress Concern Present (11/03/2023)   Received from [facility] - Occupational Stress Questionnaire    Do you feel stress - tense, restless, nervous, or anxious, or unable to sleep at night because your mind is troubled all the time - these days?: Very much  Social Connections: Socially Isolated (11/03/2023)   Received from [facility]   Social Connection and Isolation Panel    In a typical week, how many times do you talk on the phone with family, friends, or neighbors?: Twice a week    How often do you get  together with friends or relatives?: Never    How often do you attend church or religious services?: Never    Do you belong to any clubs or organizations such as church groups, unions, fraternal or athletic groups, or school groups?: No    How often do you attend meetings of the clubs or organizations you belong to?: Never    Are you married, widowed, divorced, separated, never married, or living with a partner?: Widowed  Intimate Partner Violence: Not At Risk (11/03/2023)   Received from [facility]   Humiliation, Afraid, Rape, and Kick questionnaire    Within the last year, have you been afraid of your partner or ex-partner?: No    Within the last year, have you been humiliated or emotionally abused in other ways by your partner or ex-partner?: No    Within the last year, have you been kicked, hit, slapped, or otherwise physically hurt by your partner or ex-partner?: No    Within the last year, have you been raped or forced to have any kind of sexual activity by your partner or ex-partner?: No    Review of Systems: See HPI, otherwise negative ROS  Physical Exam: BP 104/70 (BP Location: Right Arm, Patient Position: Sitting, Cuff Size: Normal)   Pulse (!) 106   Temp 98.7 F (37.1 C) (Oral)   Ht 5' 6 (1.676 m)   Wt 165 lb 3.2 oz (74.9 kg)   SpO2 93%   BMI 26.66 kg/m  General:   Alert,  Well-developed, well-nourished, pleasant and cooperative in NAD Neck:  Supple; no masses or thyromegaly. No significant cervical adenopathy. Lungs:  Clear throughout to auscultation.   No wheezes, crackles, or rhonchi. No acute distress. Heart:  Regular rate and rhythm; no murmurs, clicks, rubs,  or gallops. Abdomen: Non-distended, normal bowel sounds.  Soft and nontender without appreciable mass or hepatosplenomegaly.   Impression/Plan: 58 year old lady admitted to the hospital first of this year with enterocolitis likely a foodborne illness.  Alcohol use disorder no alcohol in since November 2024.   No evidence of portal hypertension on imaging or endoscopy other than changes consistent with portal hypertensive gastropathy.  Biopsies negative for H. pylori.  Negative colonoscopy.  She is commended on ongoing sobriety.  This lady may not have advanced chronic liver disease.  May have some room for him improving liver function so long as she abstains from alcohol use in the future.  Dysphagia resolved.  GERD well-controlled on twice daily PPI therapy.   Recommendations: Will send blood for ELF, CHEM 12 and INR.  Hep C antibody and hepatitis B surface antigen.  FibroScan to be done when scheduled  Decrease Protonix  or pantoprazole  to 40 mg once daily 30 minutes before meal  Office visit here in 6 months.  Further recommendations to follow.       Notice: This dictation was prepared with Dragon dictation along with smaller phrase technology. Any transcriptional errors that result from this process are unintentional and may not be corrected upon review.

## 2023-12-04 NOTE — Patient Instructions (Addendum)
 Congratulations on your continued sobriety!  Will send blood for ELF, CHEM 12 and INR.  Hep C antibody and hepatitis B surface antigen.  FibroScan to be done when scheduled  Decrease Protonix  or pantoprazole  to 40 mg once daily 30 minutes before meal  Office visit here in 6 months.  Further recommendations to follow.

## 2023-12-05 LAB — COMPREHENSIVE METABOLIC PANEL WITH GFR
ALT: 16 IU/L (ref 0–32)
AST: 24 IU/L (ref 0–40)
Albumin: 4.3 g/dL (ref 3.8–4.9)
Alkaline Phosphatase: 77 IU/L (ref 44–121)
BUN/Creatinine Ratio: 13 (ref 9–23)
BUN: 9 mg/dL (ref 6–24)
Bilirubin Total: 0.3 mg/dL (ref 0.0–1.2)
CO2: 22 mmol/L (ref 20–29)
Calcium: 10 mg/dL (ref 8.7–10.2)
Chloride: 99 mmol/L (ref 96–106)
Creatinine, Ser: 0.72 mg/dL (ref 0.57–1.00)
Globulin, Total: 2.3 g/dL (ref 1.5–4.5)
Glucose: 88 mg/dL (ref 70–99)
Potassium: 3.9 mmol/L (ref 3.5–5.2)
Sodium: 139 mmol/L (ref 134–144)
Total Protein: 6.6 g/dL (ref 6.0–8.5)
eGFR: 97 mL/min/1.73 (ref >59)

## 2023-12-05 LAB — HEPATITIS C ANTIBODY: Hep C Virus Ab: NONREACTIVE

## 2023-12-05 LAB — HEPATITIS B SURFACE ANTIGEN: Hepatitis B Surface Ag: NEGATIVE

## 2023-12-05 LAB — ENHANCED LIVER FIBROSIS (ELF): ELF(TM) Score: 9.69 (ref <9.80)

## 2023-12-05 LAB — PROTIME-INR
INR: 1 (ref 0.9–1.2)
Prothrombin Time: 10.6 s (ref 9.1–12.0)

## 2023-12-06 ENCOUNTER — Other Ambulatory Visit: Payer: Self-pay | Admitting: *Deleted

## 2023-12-06 DIAGNOSIS — K7460 Unspecified cirrhosis of liver: Secondary | ICD-10-CM

## 2023-12-07 ENCOUNTER — Other Ambulatory Visit (HOSPITAL_COMMUNITY)
Admission: RE | Admit: 2023-12-07 | Discharge: 2023-12-07 | Disposition: A | Discharge status: Home / Self Care | Payer: Self-pay | Source: Ambulatory Visit | Attending: Oncology | Admitting: Oncology

## 2023-12-10 ENCOUNTER — Ambulatory Visit: Admitting: Student in an Organized Health Care Education/Training Program

## 2023-12-10 ENCOUNTER — Encounter: Payer: Self-pay | Admitting: Student in an Organized Health Care Education/Training Program

## 2023-12-10 VITALS — BP 122/62 | HR 104 | Temp 97.3°F | Ht 66.0 in | Wt 164.2 lb

## 2023-12-10 DIAGNOSIS — J410 Simple chronic bronchitis: Secondary | ICD-10-CM | POA: Diagnosis not present

## 2023-12-10 DIAGNOSIS — R0602 Shortness of breath: Secondary | ICD-10-CM

## 2023-12-10 DIAGNOSIS — F17210 Nicotine dependence, cigarettes, uncomplicated: Secondary | ICD-10-CM | POA: Diagnosis not present

## 2023-12-10 DIAGNOSIS — J9611 Chronic respiratory failure with hypoxia: Secondary | ICD-10-CM | POA: Diagnosis not present

## 2023-12-10 DIAGNOSIS — F17200 Nicotine dependence, unspecified, uncomplicated: Secondary | ICD-10-CM

## 2023-12-10 LAB — PULMONARY FUNCTION TEST
DL/VA % pred: 67 %
DL/VA: 2.79 ml/min/mmHg/L
DLCO unc % pred: 57 %
DLCO unc: 12.5 ml/min/mmHg
FEF 25-75 Post: 1.49 L/s
FEF 25-75 Pre: 1.11 L/s
FEF2575-%Change-Post: 33 %
FEF2575-%Pred-Post: 58 %
FEF2575-%Pred-Pre: 43 %
FEV1-%Change-Post: 8 %
FEV1-%Pred-Post: 66 %
FEV1-%Pred-Pre: 61 %
FEV1-Post: 1.86 L
FEV1-Pre: 1.71 L
FEV1FVC-%Change-Post: 0 %
FEV1FVC-%Pred-Pre: 88 %
FEV6-%Change-Post: 8 %
FEV6-%Pred-Post: 75 %
FEV6-%Pred-Pre: 69 %
FEV6-Post: 2.62 L
FEV6-Pre: 2.42 L
FEV6FVC-%Change-Post: 0 %
FEV6FVC-%Pred-Post: 101 %
FEV6FVC-%Pred-Pre: 102 %
FVC-%Change-Post: 7 %
FVC-%Pred-Post: 73 %
FVC-%Pred-Pre: 68 %
FVC-Post: 2.66 L
FVC-Pre: 2.46 L
Post FEV1/FVC ratio: 70 %
Post FEV6/FVC ratio: 98 %
Pre FEV1/FVC ratio: 70 %
Pre FEV6/FVC Ratio: 99 %
RV % pred: 119 %
RV: 2.46 L
TLC % pred: 96 %
TLC: 5.16 L

## 2023-12-10 NOTE — Patient Instructions (Signed)
 Full PFT completed today ? ?

## 2023-12-10 NOTE — Progress Notes (Signed)
 Full PFT completed today ? ?

## 2023-12-10 NOTE — Patient Instructions (Signed)
 The [place]  Quitline: Call 1-800-QUIT-NOW (682 840 6469). The [place] Quitline is a free service for [place]  residents. Trained counselors are available from 8 am until 3 am, 365 days per year. Services are available in both [place] and [place].   Web Resources Free online support programs can help you track your progress and share experiences with others who are quitting. These are examples: www.becomeanex.org www.trytostop.org  www.smokefree.gov  www.https://www.vargas.com/.aspx  [facility]: offers comprehensive in-person tobacco treatment counseling at [facility] building ([address], [place] [address]).  Open to everyone. Virtual appointments available. Free parking. Call [phone] to schedule an appointment or [phone] for general information.    Tobacco Cessation Medications  Nicotine  Replacement Therapy (NRT)  Nicotine  is the addictive part of tobacco smoke, but not the most dangerous part. There are 7000 other toxins in cigarettes, including carbon monoxide, that cause disease. People do not generally become addicted to medication. Common problems: People don't use enough medication or stop too early. Medications are safe and effective. Overdose is very uncommon. Use medications as long as needed (3 months minimum). Some combinations work better than single medications. Long acting medications like the NRT patch and bupropion provide continuous treatment for withdrawal symptoms.  PLUS  Short acting medications like the NRT gum, lozenge, inhaler, and nasal spray help people to cope with breakthrough cravings.  ? Nicotine  Patch  Place patch on hairless skin on upper body, including arms and back. Each day: discard old patch, shower, apply new patch to a different site. Apply hydrocortisone cream to mildly red/irritated areas. Call provider if rash develops. If patch causes sleep disturbance, remove patch  at bedtime and replace each morning after shower. Side effects may include: skin irritation, headache, insomnia, abnormal/vivid dreams.  ? Nicotine  Gum  Chew gum slowly, park in cheek when peppery taste or tingling sensation begins (about 15-30 chews). When taste or tingling goes away, begin chewing again. Use until nicotine  is gone (taste or tingle does not return, usually 30 minutes). Park in different areas of mouth. Nicotine  is absorbed through the lining of the mouth. Use enough to control cravings, up to 24 pieces per day (if used alone). Avoid eating or drinking for 15 minutes before using and during use. Side effects may include: mouth/jaw soreness, hiccups, indigestion, hypersalivation.  If gum is not chewed correctly, additional side effects may include lightheadedness, nausea/vomiting, throat and mouth irritation.  ? Nicotine  Lozenge  Allow to dissolve slowly in mouth (20-30 minutes). Do not chew or swallow. Nicotine  release may cause a warm tingling sensation. Occasionally rotate to different areas of the mouth. Use enough to control cravings, up to 20 lozenges per day (if used alone). Avoid eating or drinking for 15 minutes before using and during use. Side effects may include: nausea, hiccups, cough, heartburn, headache, gas, insomnia.  ? Nicotine  Nasal Spray Use 1 spray in each nostril (1 dose) and tilt head back for 1 minute. Do not sniff, swallow, or inhale through nose.  Use at least 8 doses (1 spray in each nostril) , up to 40 doses per day (if used alone). To reduce nasal irritation, spray on cotton swab and insert into nose. Side effects may include: nasal and/or throat irritation (hot, peppery, or burning sensation), nasal irritation, tearing, sneezing, cough, headache.  ? Nicotine  Oral Inhaler (puffer) Inhale into the back of the throat or puff in short breaths. Do not inhale into the lungs.  Puff continuously for 20 minutes (about 80 puffs) until cartridge  is  empty. Change cartridge when it loses the "burning in throat" sensation (feels like air only). Open cartridges can be saved and used again within 24 hours. Use at least 6 and up to 16 cartridges per day (if used alone).  Avoid eating or drinking for 15 minutes before using and during use. Side effects may include: mouth and/or throat irritation, unpleasant taste, cough, nasal irritation, indigestion, hiccups, headache.  ? Chantix  (varenicline ) Days 1-3: Take one 0.5 mg white pill each morning for 3 days, one week before quit date. Days 4-7: Increase to one 0.5 mg white pill twice a day in morning and evening for 4 days.  On Day 8 (target quit date), increase to one 1 mg blue pill twice a day. Maintain this dose for a minimum of 3 months. Take with food and a full glass of water to reduce nausea. Be sure that the two doses are at least 8 hours apart, but try to take second dose early in the evening (i.e. 6 pm) to avoid sleep problems. Common side effects include: nausea, insomnia, headache, abnormal/vivid dreams. Tell your doctor if you have any history of psychiatric illness prior to starting Chantix .  STOP taking CHANTIX  and contact a healthcare provider immediately if you experience agitation, hostility, depressed mood, changes in thoughts or behavior that are not typical for you, thinking about or attempting suicide, allergic or skin reactions including swelling, rash, redness, or peeling of the skin.  For patients who have heart disease: Smoking is a major risk factor for cardiovascular disease, and Chantix  can help you quit smoking. Chantix  may be associated with a small, increased risk of certain heart events in patients who have heart disease. If you have any new or worsening symptoms of heart disease while taking Chantix , such as shortness of breath or trouble breathing, new or worsening chest pain, or new or worsening pain in your legs when walking, call your doctor or get emergency medical  help immediately.  ? Wellbutrin / Zyban (bupropion) Take one 150 mg pill each morning for 3 days, one week before target quit date. On Day 4, increase to one 150 mg pill twice a day, morning and evening.  Maintain this dose for a minimum of 3 months. Be sure that the two doses are at least 8 hours apart, but try to take second dose early in the evening (i.e. 6 pm) to avoid sleep problems. Avoid or minimize use of alcohol when taking this medication. Common side effects include: dry mouth, headache, insomnia, nausea, weight loss.  Risk of seizure is 05/998. STOP taking BUPROPION and contact a healthcare provider immediately if you experience agitation, hostility, depressed mood, changes in thoughts or behavior that are not typical for you, thinking about or attempting suicide, allergic or skin reactions including swelling, rash, redness, or peeling of the skin.

## 2023-12-10 NOTE — Progress Notes (Signed)
 Assessment & Plan:   #Chronic hypoxic respiratory failure (HCC) #COPD GOLD   Presents for follow up of COPD, with PFT's showing FEV1/FVC ratio of 0.7, with moderately reduced FEV1 of 1.86L (66% predicted, zscore -2.88). Lung volumes show air trapping, and DLCO is moderately reduced. This is overall consistent with COPD GOLD 2-B.   She has a peak eosinophil count of 700 06/2023, raising the possibility of Asthma/COPD overlap. Should she continue to have exacerbations, I will escalate therapy by adding Dupilumab to her regimen. For now, we will continue with triple ICS/LABA/LAMA therapy. I have also counseled her on the importance of smoking cessation and will refer her to pulmonary rehab.  As to her chronic hypoxic respiratory failure, she has nocturnal hypoxia (to the high 70's) as evidenced by overnight oximetry. At rest, she has normal oxygenation. This does drop to the high 80's with exertion, and she is symptomatic. I encouraged her to use oxygen with sleep as well as with exertion.  Finally, she describes symptoms of sleep apnea but is not interested in testing at this point. Will defer to our follow up visit.  - AMB referral to pulmonary rehabilitation  #Tobacco Use Disorder  Continues to smoke, which is contributing to uncontrollable disease. Counseled extensively about the importance of smoking cessation.  Return in about 6 months (around 06/11/2024).  I spent 35 minutes caring for this patient today, including preparing to see the patient, obtaining a medical history , reviewing a separately obtained history, performing a medically appropriate examination and/or evaluation, counseling and educating the patient/family/caregiver, ordering medications, tests, or procedures, documenting clinical information in the electronic health record, and independently interpreting results (not separately reported/billed) and communicating results to the patient/family/caregiver  Tara November,  MD [facility]  End of visit medications:  No orders of the defined types were placed in this encounter.    Current Outpatient Medications:    albuterol  (PROVENTIL ) (2.5 MG/3ML) 0.083% nebulizer solution, Take 2.5 mg by nebulization every 6 (six) hours as needed., Disp: , Rfl:    albuterol  (VENTOLIN  HFA) 108 (90 Base) MCG/ACT inhaler, Inhale 2 puffs into the lungs every 6 (six) hours as needed for wheezing or shortness of breath., Disp: 6.7 g, Rfl: 0   allopurinol (ZYLOPRIM) 300 MG tablet, Take 300 mg by mouth daily., Disp: , Rfl:    B Complex Vitamins (B COMPLEX-B12) TABS, Take by mouth., Disp: , Rfl:    budesonide -formoterol  (SYMBICORT ) 160-4.5 MCG/ACT inhaler, Inhale 2 puffs into the lungs in the morning and at bedtime., Disp: 120 each, Rfl: 12   cyanocobalamin (VITAMIN B12) 1000 MCG tablet, Take 1 tablet by mouth daily., Disp: , Rfl:    cyclobenzaprine (FLEXERIL) 10 MG tablet, Take 10 mg by mouth 3 (three) times daily as needed., Disp: , Rfl:    dicyclomine (BENTYL) 10 MG capsule, Take 10 mg by mouth daily. (Patient taking differently: Take 10 mg by mouth daily as needed.), Disp: , Rfl:    DULoxetine  (CYMBALTA ) 60 MG capsule, Take 60 mg by mouth daily., Disp: , Rfl:    folic acid (FOLVITE) 1 MG tablet, Take 1 mg by mouth daily., Disp: , Rfl:    levalbuterol  (XOPENEX  HFA) 45 MCG/ACT inhaler, Inhale 2 puffs into the lungs every 6 (six) hours as needed for wheezing or shortness of breath., Disp: 15 g, Rfl: 12   levothyroxine  (SYNTHROID ) 50 MCG tablet, Take 50 mcg by mouth daily before breakfast. Patient reports she takes 50 mcg daily not 75 mcg, Disp: ,  Rfl:    lidocaine  (LIDODERM ) 5 %, Place 1 patch onto the skin daily., Disp: , Rfl:    OXYGEN, Inhale 2 L into the lungs at bedtime. PRN during the day, Disp: , Rfl:    pantoprazole  (PROTONIX ) 40 MG tablet, Take 1 tablet (40 mg total) by mouth 2 (two) times daily before a meal., Disp: 60 tablet, Rfl: 3    Spacer/Aero-Holding Chambers (AEROCHAMBER MV) inhaler, Use as instructed, Disp: 1 each, Rfl: 0   Tiotropium Bromide Monohydrate  (SPIRIVA  RESPIMAT) 2.5 MCG/ACT AERS, Inhale 2 puffs into the lungs daily., Disp: 60 each, Rfl: 11   Subjective:   PATIENT ID: Tara Lewis GENDER: female DOB: [birth year], MRN: [id number]  Chief Complaint  Patient presents with   Follow-up    DOE. Wheezing. Cough with white sputum. Drainage at night.    HPI  Patient is a pleasant 58 year old female with a past medical history of alcohol use disorder and hypothyroidism who presents today for follow up. I last met with Tara Lewis in March of 2025.  Initial onset of symptoms prompting her to seek care was around Lewis 2024 when she experienced weakness and abdominal discomfort and found to have C. difficile and pancreatitis.  She continues to be weak after said admission with the subsequent development of progressive shortness of breath and associated cough.  She was seen by her primary care provider who noted her to be very hypoxic with an SpO2 of 60%.  She was asked to go to the emergency department where she was admitted to [facility] for acute hypoxic respiratory failure secondary to presumed COPD exacerbation.   In the hospital, she was initially on high flow nasal cannula subsequently trended down to 2 L of [place].  She was treated with a course of steroids as well as broad-spectrum antibiotics with improvement.  A CT scan of the chest ruled out PE and was notable for some groundglass opacities as well as right hilar lymph nodes.  Patient was maintained on Symbicort  which was continued on discharge. Spiriva  Respimat was initiated after her last visit in clinic with me.  Return Visit 09/11/2023:  Since our last visit, she called in reporting nocturnal cough and GERD was suspected. She was prescribed a course of antibiotics, with PPI previously prescribed by her gastroenterologist. She was recommended dietary and  lifestyle modifications for GERD. Patient is currently only using her oxygen at night and with significant exertion. She has been compliant with her inhalers but has continued to smoke. No new symptoms reported. She's had her repeat CT and is here to discuss results, but had not underwent pulmonary function testing yet.  Return Visit 12/10/2023:  Returns for follow up and has not had any exacerbations since our last visit. She has more energy, but remains short of breath with exertion. She was seen in the ED on 7/5 with multiple chief complaints, including increased cough. Overall feels better. She reports some nocturnal awakening, snoring, and stopping breathing at night. She has continued to smoke. No fevers, chills, night sweats, or increased sputum production reported.  Patient previously worked as a Interior and spatial designer and reported some respiratory symptoms ongoing at work.  She has a longstanding history of smoking, having smoked between 1 and 1.5 pack/day since the age of 11, with at least 40 pack years of smoking history.  She continues to smoke.  Ancillary information including prior medications, full medical/surgical/family/social histories, and PFTs (when available) are listed below and have been reviewed.   Review of Systems  Constitutional:  Negative for chills, diaphoresis, fever and weight loss.  Respiratory:  Positive for shortness of breath. Negative for cough, hemoptysis, sputum production and wheezing.   Cardiovascular:  Negative for chest pain and leg swelling.     Objective:   Vitals:   12/10/23 1408  BP: 122/62  Pulse: (!) 104  Temp: (!) 97.3 F (36.3 C)  SpO2: 92%  Weight: 164 lb 3.2 oz (74.5 kg)  Height: 5' 6 (1.676 m)   92% on RA. Trend pulse ox on room with with hypoxia to 87%  BMI Readings from Last 3 Encounters:  12/10/23 26.50 kg/m  12/10/23 26.50 kg/m  12/04/23 26.66 kg/m   Wt Readings from Last 3 Encounters:  12/10/23 164 lb 3.2 oz (74.5 kg)  12/10/23  164 lb 3.2 oz (74.5 kg)  12/04/23 165 lb 3.2 oz (74.9 kg)    Physical Exam    Ancillary Information    Past Medical History:  Diagnosis Date   Anxiety    Carpal tunnel syndrome    COPD (chronic obstructive pulmonary disease) (HCC)    Depression    Fibromyalgia    GERD (gastroesophageal reflux disease)    Graves' disease    Hypothyroidism      Family History  Problem Relation Age of Onset   Kidney disease Mother    Thyroid  disease Mother    Fibromyalgia Mother    Asthma Mother    Hypertension Father    Heart attack Father        3 heart attacks   Heart disease Father    Thyroid  disease Sister    Fibromyalgia Sister      Past Surgical History:  Procedure Laterality Date   ABDOMINAL HYSTERECTOMY  2000s   BIOPSY  05/09/2023   Procedure: BIOPSY;  Surgeon: Shaaron Lamar HERO, MD;  [facility];  Service: Endoscopy;;   COLONOSCOPY WITH PROPOFOL  N/A 05/09/2023   Procedure: COLONOSCOPY WITH PROPOFOL ;  Surgeon: Shaaron Lamar HERO, MD;  Location: [facility];  Service: Endoscopy;  Laterality: N/A;  200pm, asa 4   ESOPHAGOGASTRODUODENOSCOPY (EGD) WITH PROPOFOL  N/A 05/09/2023   Procedure: ESOPHAGOGASTRODUODENOSCOPY (EGD) WITH PROPOFOL ;  Surgeon: Shaaron Lamar HERO, MD;  Location: [facility];  Service: Endoscopy;  Laterality: N/A;   KNEE SURGERY Right    MALONEY DILATION  05/09/2023   Procedure: MALONEY DILATION;  Surgeon: Shaaron Lamar HERO, MD;  Location: [facility];  Service: Endoscopy;;   OVARIAN CYST SURGERY      Social History   Socioeconomic History   Marital status: Widowed    Spouse name: Not on file   Number of children: Not on file   Years of education: Not on file   Highest education level: Not on file  Occupational History   Not on file  Tobacco Use   Smoking status: Every Day    Current packs/day: 1.50    Average packs/day: 1.5 packs/day for 40.6 years (60.9 ttl pk-yrs)    Types: Cigarettes    Start date: 1985   Smokeless tobacco: Never   Tobacco  comments:    Started smoked at 58 years old    Smoked 1.5 PPD at her heaviest    1.5 PPD - khj 12/10/2023  Vaping Use   Vaping status: Some Days   Substances: Nicotine   Substance and Sexual Activity   Alcohol use: Not Currently    Comment: 3-4 cans of beer/daily   Drug use: No   Sexual activity: Never    Birth control/protection: Surgical  Comment: last time approx october 2017  Other Topics Concern   Not on file  Social History Narrative   Are you right handed or left handed? Right Handed    Are you currently employed ? Yes   What is your current occupation? [employer]    Do you live at home alone? Yes   Who lives with you? Boyfriend stays with her sometimes    What type of home do you live in: 1 story or 2 story? Lives in a one story home        Social Drivers of Health   Financial Resource Strain: Low Risk  (11/03/2023)   Received from [facility]   Overall Financial Resource Strain (CARDIA)    How hard is it for you to pay for the very basics like food, housing, medical care, and heating?: Not very hard  Food Insecurity: No Food Insecurity (11/03/2023)   Received from [facility]   Hunger Vital Sign    Within the past 12 months, you worried that your food would run out before you got the money to buy more.: Never true    Within the past 12 months, the food you bought just didn't last and you didn't have money to get more.: Never true  Transportation Needs: No Transportation Needs (11/03/2023)   Received from [facility] - Transportation    Lack of Transportation (Medical): No    Lack of Transportation (Non-Medical): No  Physical Activity: Inactive (11/03/2023)   Received from [facility]   Exercise Vital Sign    On average, how many days per week do you engage in moderate to strenuous exercise (like a brisk walk)?: 0 days    On average, how many minutes do you engage in exercise at this level?: 0 min  Stress: Stress Concern Present (11/03/2023)    Received from [facility] - Occupational Stress Questionnaire    Do you feel stress - tense, restless, nervous, or anxious, or unable to sleep at night because your mind is troubled all the time - these days?: Very much  Social Connections: Socially Isolated (11/03/2023)   Received from [facility]   Social Connection and Isolation Panel    In a typical week, how many times do you talk on the phone with family, friends, or neighbors?: Twice a week    How often do you get together with friends or relatives?: Never    How often do you attend church or religious services?: Never    Do you belong to any clubs or organizations such as church groups, unions, fraternal or athletic groups, or school groups?: No    How often do you attend meetings of the clubs or organizations you belong to?: Never    Are you married, widowed, divorced, separated, never married, or living with a partner?: Widowed  Intimate Partner Violence: Not At Risk (11/03/2023)   Received from [facility]   Humiliation, Afraid, Rape, and Kick questionnaire    Within the last year, have you been afraid of your partner or ex-partner?: No    Within the last year, have you been humiliated or emotionally abused in other ways by your partner or ex-partner?: No    Within the last year, have you been kicked, hit, slapped, or otherwise physically hurt by your partner or ex-partner?: No    Within the last year, have you been raped or forced  to have any kind of sexual activity by your partner or ex-partner?: No     Allergies  Allergen Reactions   Other Other (See Comments)    Skin tears easily    Makes legs hurt   Tape Other (See Comments)    Skin tears easily   Codeine Other (See Comments)    Patient reports stomach pain.   Statins Other (See Comments)    Makes legs hurt     CBC    Component Value Date/Time   WBC 10.0 06/12/2023 1038   WBC 8.1 06/12/2016 0727   RBC 5.05  06/12/2023 1038   RBC 4.74 06/12/2016 0727   HGB 14.9 06/12/2023 1038   HCT 46.5 06/12/2023 1038   PLT 220 06/12/2023 1038   MCV 92 06/12/2023 1038   MCH 29.5 06/12/2023 1038   MCH 30.8 06/12/2016 0727   MCHC 32.0 06/12/2023 1038   MCHC 33.6 06/12/2016 0727   RDW 12.9 06/12/2023 1038   LYMPHSABS 2.1 06/12/2023 1038   MONOABS 648 06/12/2016 0727   EOSABS 0.7 (H) 06/12/2023 1038   BASOSABS 0.1 06/12/2023 1038    Pulmonary Functions Testing Results:    Latest Ref Rng & Units 12/10/2023   12:42 PM  PFT Results  FVC-Pre L 2.46  P  FVC-Predicted Pre % 68  P  FVC-Post L 2.66  P  FVC-Predicted Post % 73  P  Pre FEV1/FVC % % 70  P  Post FEV1/FCV % % 70  P  FEV1-Pre L 1.71  P  FEV1-Predicted Pre % 61  P  FEV1-Post L 1.86  P  DLCO uncorrected ml/min/mmHg 12.50  P  DLCO UNC% % 57  P  DLVA Predicted % 67  P  TLC L 5.16  P  TLC % Predicted % 96  P  RV % Predicted % 119  P    P Preliminary result    Outpatient Medications Prior to Visit  Medication Sig Dispense Refill   albuterol  (PROVENTIL ) (2.5 MG/3ML) 0.083% nebulizer solution Take 2.5 mg by nebulization every 6 (six) hours as needed.     albuterol  (VENTOLIN  HFA) 108 (90 Base) MCG/ACT inhaler Inhale 2 puffs into the lungs every 6 (six) hours as needed for wheezing or shortness of breath. 6.7 g 0   allopurinol (ZYLOPRIM) 300 MG tablet Take 300 mg by mouth daily.     B Complex Vitamins (B COMPLEX-B12) TABS Take by mouth.     budesonide -formoterol  (SYMBICORT ) 160-4.5 MCG/ACT inhaler Inhale 2 puffs into the lungs in the morning and at bedtime. 120 each 12   cyanocobalamin (VITAMIN B12) 1000 MCG tablet Take 1 tablet by mouth daily.     cyclobenzaprine (FLEXERIL) 10 MG tablet Take 10 mg by mouth 3 (three) times daily as needed.     dicyclomine (BENTYL) 10 MG capsule Take 10 mg by mouth daily. (Patient taking differently: Take 10 mg by mouth daily as needed.)     DULoxetine  (CYMBALTA ) 60 MG capsule Take 60 mg by mouth daily.     folic  acid (FOLVITE) 1 MG tablet Take 1 mg by mouth daily.     levalbuterol  (XOPENEX  HFA) 45 MCG/ACT inhaler Inhale 2 puffs into the lungs every 6 (six) hours as needed for wheezing or shortness of breath. 15 g 12   levothyroxine  (SYNTHROID ) 50 MCG tablet Take 50 mcg by mouth daily before breakfast. Patient reports she takes 50 mcg daily not 75 mcg     lidocaine  (LIDODERM ) 5 % Place 1 patch onto  the skin daily.     OXYGEN Inhale 2 L into the lungs at bedtime. PRN during the day     pantoprazole  (PROTONIX ) 40 MG tablet Take 1 tablet (40 mg total) by mouth 2 (two) times daily before a meal. 60 tablet 3   Spacer/Aero-Holding Chambers (AEROCHAMBER MV) inhaler Use as instructed 1 each 0   Tiotropium Bromide Monohydrate  (SPIRIVA  RESPIMAT) 2.5 MCG/ACT AERS Inhale 2 puffs into the lungs daily. 60 each 11   DULoxetine  (CYMBALTA ) 30 MG capsule Take 1 capsule (30 mg total) by mouth daily. (Patient taking differently: Take 60 mg by mouth daily.) 30 capsule 3   No facility-administered medications prior to visit.

## 2023-12-11 ENCOUNTER — Ambulatory Visit: Payer: Self-pay | Admitting: Internal Medicine

## 2023-12-12 ENCOUNTER — Encounter (HOSPITAL_COMMUNITY): Payer: Self-pay

## 2023-12-16 ENCOUNTER — Other Ambulatory Visit: Payer: Self-pay | Admitting: Internal Medicine

## 2023-12-17 ENCOUNTER — Encounter (HOSPITAL_COMMUNITY)
Admission: RE | Admit: 2023-12-17 | Discharge: 2023-12-17 | Disposition: A | Discharge status: Home / Self Care | Source: Ambulatory Visit | Attending: Student in an Organized Health Care Education/Training Program | Admitting: Student in an Organized Health Care Education/Training Program

## 2023-12-17 ENCOUNTER — Telehealth (HOSPITAL_COMMUNITY): Payer: Self-pay

## 2023-12-17 DIAGNOSIS — J9611 Chronic respiratory failure with hypoxia: Secondary | ICD-10-CM | POA: Insufficient documentation

## 2023-12-17 DIAGNOSIS — J410 Simple chronic bronchitis: Secondary | ICD-10-CM | POA: Insufficient documentation

## 2023-12-17 LAB — GENECONNECT MOLECULAR SCREEN: Genetic Analysis Overall Interpretation: NEGATIVE

## 2023-12-17 NOTE — Progress Notes (Signed)
 Virtual orientation visit completed for pulmonary rehab with chronic hypoxic respiratory failure and simple chronic bronchitis. On-site orientation visit scheduled for 12/26/23 at 800.

## 2023-12-20 ENCOUNTER — Telehealth: Payer: Self-pay | Admitting: Internal Medicine

## 2023-12-20 NOTE — Telephone Encounter (Signed)
 Received medical records request from [facility] and sent most recent colonoscopy report to them.

## 2023-12-23 ENCOUNTER — Ambulatory Visit: Payer: Self-pay | Admitting: Nurse Practitioner

## 2023-12-25 ENCOUNTER — Encounter (HOSPITAL_COMMUNITY)

## 2023-12-26 ENCOUNTER — Encounter (HOSPITAL_COMMUNITY)

## 2024-01-01 ENCOUNTER — Telehealth: Payer: Self-pay

## 2024-01-01 NOTE — Telephone Encounter (Signed)
Lmom for return call.  

## 2024-01-01 NOTE — Telephone Encounter (Signed)
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  She is a lean.  She is not diabetic.  Glad she has stopped drinking.  Given F2 F3 status she needs hepatoma screening with ultrasound every 6 months  Should be getting Pap hepatitis may be vaccination.  Lets plan to get a repeat FibroScan in 1 year.

## 2024-01-02 NOTE — Telephone Encounter (Signed)
 Pt was made aware and verbalized understanding.   Mandy/Ladonna: please nic us  in 6 mths and fibroscan in 1 yr

## 2024-02-25 ENCOUNTER — Ambulatory Visit: Attending: Nurse Practitioner | Admitting: Nurse Practitioner

## 2024-02-25 ENCOUNTER — Encounter: Payer: Self-pay | Admitting: Nurse Practitioner

## 2024-02-25 VITALS — BP 128/80 | HR 86 | Ht 65.5 in | Wt 170.8 lb

## 2024-02-25 DIAGNOSIS — R635 Abnormal weight gain: Secondary | ICD-10-CM | POA: Insufficient documentation

## 2024-02-25 DIAGNOSIS — Z87898 Personal history of other specified conditions: Secondary | ICD-10-CM | POA: Insufficient documentation

## 2024-02-25 DIAGNOSIS — J449 Chronic obstructive pulmonary disease, unspecified: Secondary | ICD-10-CM | POA: Insufficient documentation

## 2024-02-25 DIAGNOSIS — R0602 Shortness of breath: Secondary | ICD-10-CM | POA: Insufficient documentation

## 2024-02-25 NOTE — Patient Instructions (Signed)
Medication Instructions:   Continue all current medications.   Labwork:  none  Testing/Procedures:  Your physician has requested that you have an echocardiogram. Echocardiography is a painless test that uses sound waves to create images of your heart. It provides your doctor with information about the size and shape of your heart and how well your heart's chambers and valves are working. This procedure takes approximately one hour. There are no restrictions for this procedure. Please do NOT wear cologne, perfume, aftershave, or lotions (deodorant is allowed). Please arrive 15 minutes prior to your appointment time.  Please note: We ask at that you not bring children with you during ultrasound (echo/ vascular) testing. Due to room size and safety concerns, children are not allowed in the ultrasound rooms during exams. Our front office staff cannot provide observation of children in our lobby area while testing is being conducted. An adult accompanying a patient to their appointment will only be allowed in the ultrasound room at the discretion of the ultrasound technician under special circumstances. We apologize for any inconvenience. Office will contact with results via phone, letter or mychart.     Follow-Up:  3 months   Any Other Special Instructions Will Be Listed Below (If Applicable).   If you need a refill on your cardiac medications before your next appointment, please call your pharmacy.

## 2024-02-25 NOTE — Progress Notes (Unsigned)
 Cardiology Office Note   Date:  02/25/2024 ID:  Dreanna Kyllo, DOB Jul 29, 1965, MRN [id number] PCP: Rosan Jacquline NOVAK, NP  [facility] Providers Cardiologist:  Diannah SHAUNNA Maywood, MD     History of Present Illness Tara Lewis is a 58 y.o. female with a PMH of Graves' disease, s/p RAI ablation -> hypothyroidism, GERD, COPD, fibromyalgia, anxiety, depression, tobacco abuse, who presents today for follow-up.  Last seen by Dr. Mallipeddi on April 06, 2023 for evaluation of hypotension.  It was noted patient had decreased p.o. intake for 3 days prior to hospitalization at [facility] in November 2024.  Also noted to be consuming alcohol every day, noted abdominal pain.  Was feeling dizzy with getting up and walking, BP noted low, SBP around 80s.  Hospital admission November 2024 for alcohol induced acute pancreatitis/colitis.  Blood pressure improved, denied any chest pain.  Continue to note feeling dizzy but overall improved.  Denied any syncope.  Noted to be sober for 30 days at the time.  Monitor was obtained-see results below.   11/07/2023 - She is here for follow-up today with her husband.  Overall doing well from a cardiac perspective.  Does tell me that prior to her hospital/ED visit at [facility] on July 4 she, she was having no appetite, no energy running a fever, and having cold chills per husband's report.  She shows me chest x-ray report that was negative for anything acute.  Overall lab work unremarkable.  She currently wears 3 L of oxygen continuously-previously she had been wearing only 2 L oxygen at night or as needed.  Does follow pulmonologist regularly.  Says her increased oxygen demand began earlier this year in March 2025 with previous hospital stay for double pneumonia, this is where her oxygen therapy came into play.  Overall breathing is stable.  Does have some moments where she will wake up with gasping and coughs up phlegm.  Says this has cleared up, currently taking  Mucinex per husband's report.  Overall feeling well today. Denies any chest pain, worsening shortness of breath, recent palpitations, syncope, presyncope, dizziness, orthopnea, PND, swelling or significant weight changes, acute bleeding, or claudication.  Tells me she had a stress test with [facility] last month that was negative.  I do not have these results as of yet.  She is currently being followed by her PCP at [facility] internal for her thyroid  disease. Does notice some palpitations at times, not recently though  02/25/2024 -here for follow-up.  She says she has improved quite a bit healthwise since I last saw her.  Continues to note back/neck pains, scheduled for back procedure/ablation in November 2024.  She has had issues with her thyroid  recently, admits to weight gain related to this.  She is following her primary care doctor who is managing this.  Admits to some shortness of breath noticed by her. Denies any chest pain, palpitations, syncope, presyncope, dizziness, orthopnea, PND, swelling, acute bleeding, or claudication.  ROS: Negative. See HPI.   Studies Reviewed  EKG: EKG is not ordered today.     Cardiac monitor 03/2024:    Patch wear time was for 13 days and 23 hours.   Normal sinus rhythm predominantly ranging from 65 to 211 bpm with an average HR 90 bpm.   1 run of SVT lasting 4 beats, 1 run of NSVT lasting 5 beats.  No AV block or pauses.   <1% PAC burden and <1% PVC burden.   Symptoms correlated with normal sinus rhythm, 89  to 99 bpm.   Vascular US  lower extremity venous reflux right 04/2023:  Summary:  Right:  - No evidence of deep vein thrombosis from the common femoral through the  popliteal veins.  - No evidence of superficial venous thrombosis.  - The deep venous system is not competent.  - The great saphenous vein is not competent (proximal and mid thigh only).  -The small saphenous vein is competent.    *See table(s) above for measurements and observations.   Physical  Exam VS:  BP 128/80   Pulse 86   Ht 5' 5.5 (1.664 m)   Wt 170 lb 12.8 oz (77.5 kg)   SpO2 93%   BMI 27.99 kg/m        Wt Readings from Last 3 Encounters:  02/25/24 170 lb 12.8 oz (77.5 kg)  12/10/23 164 lb 3.2 oz (74.5 kg)  12/10/23 164 lb 3.2 oz (74.5 kg)    GEN: Well nourished, well developed in no acute distress NECK: No JVD; No carotid bruits CARDIAC: S1/S2, RRR, no murmurs, rubs, gallops RESPIRATORY:  Clear to auscultation without rales, wheezing or rhonchi  ABDOMEN: Soft, non-tender, non-distended EXTREMITIES:  No edema; No deformity   ASSESSMENT AND PLAN  Hx of palpitations Denies any palpitations or tachycardia. This is usually associated after using her albuterol  inhaler.  Want to avoid beta-blockers due to her COPD status.  If heart rate control needed in the future-would recommend diltiazem.  COPD, shortness of breath, weight gain She is on room air during visit.  Does admit to some shortness of breath and will arrange echocardiogram for further evaluation.  She has weight gain most likely related to her thyroid  disease-recommend to continue follow-up with PCP for evaluation of this.  Does not appear volume overloaded and does not appear like acute CHF.  She is not in acute distress.  Care and ED precautions discussed.      Dispo: Follow-up with me/APP in 3 months or sooner any changes.  Signed, Almarie Crate, NP

## 2024-03-10 ENCOUNTER — Ambulatory Visit: Attending: Nurse Practitioner

## 2024-03-10 DIAGNOSIS — R0602 Shortness of breath: Secondary | ICD-10-CM | POA: Diagnosis not present

## 2024-03-10 DIAGNOSIS — R635 Abnormal weight gain: Secondary | ICD-10-CM | POA: Insufficient documentation

## 2024-03-10 NOTE — Progress Notes (Signed)
  Ms. Carrell's perioperative risk of a major cardiac event is 0.9% according to the Revised Cardiac Risk Index (RCRI).  Therefore, she is at low risk for perioperative complications.   Her functional capacity is fair at 4.64 METs according to the Duke Activity Status Index (DASI). Recommendations: According to ACC/AHA guidelines, no further cardiovascular testing needed.  The patient may proceed to surgery at acceptable risk.   Antiplatelet and/or Anticoagulation Recommendations: Patient is not on any antiplatelet or anticoagulation medication that needs to be held prior to procedure.  Almarie Crate, NP

## 2024-03-11 LAB — ECHOCARDIOGRAM COMPLETE
AR max vel: 2.03 cm2
AV Area VTI: 2.17 cm2
AV Area mean vel: 2 cm2
AV Mean grad: 3.9 mmHg
AV Peak grad: 7.6 mmHg
Ao pk vel: 1.37 m/s
Area-P 1/2: 3.27 cm2
Calc EF: 64.3 %
S' Lateral: 3.4 cm
Single Plane A2C EF: 61 %
Single Plane A4C EF: 66.5 %

## 2024-03-25 ENCOUNTER — Ambulatory Visit: Payer: Self-pay | Admitting: Nurse Practitioner

## 2024-04-15 ENCOUNTER — Other Ambulatory Visit: Payer: Self-pay | Admitting: Internal Medicine

## 2024-05-16 ENCOUNTER — Other Ambulatory Visit (HOSPITAL_COMMUNITY): Payer: Self-pay | Admitting: Orthopaedic Surgery

## 2024-05-16 DIAGNOSIS — S92144A Nondisplaced dome fracture of right talus, initial encounter for closed fracture: Secondary | ICD-10-CM

## 2024-05-20 NOTE — Care Plan (Signed)
 Shift Summary HYDROcodone-acetaminophen was administered for acute pain, resulting in significant pain reduction and improved comfort.  Oxygen therapy was maintained with a reduction in flow rate while oxygen saturation remained stable above 93%.  Fall reduction and bed safety interventions were consistently applied, with no alarms or safety incidents reported.  Skin integrity was supported through absorbent pads, positioning supports, and limited adhesive use, with no new skin injuries documented.  Overall, comfort and safety were maintained, and pain was effectively managed during the shift.  Absence of Hospital-Acquired Illness or Injury: No new hospital-acquired injuries or illnesses documented during the shift; fall reduction program and bed safety interventions were consistently maintained, and no alarms were present at any time.  Optimal Comfort and Wellbeing: Anxiety was present but relieved with anti-anxiety medication; comfort measures such as frequent repositioning and pressure reduction techniques were implemented throughout the shift.  Optimal Pain Control and Function: Right-sided acute aching pain decreased from 8 to 2 after HYDROcodone-acetaminophen administration, and patient was able to sleep following intervention.  Optimal Gas Exchange: Oxygen saturation remained above 93% with a reduction in O2 flow rate from 6 to 5 L/min, and respiratory rate was stable; deep/controlled cough was encouraged and breath sounds on the left continued to have expiratory wheeze.  Skin Health and Integrity: Skin assessment revealed abrasion and bruising with thin epidermis and loss of subcutaneous tissue; absorbent pad and positioning supports were utilized, and adhesive use was limited to protect skin integrity.

## 2024-05-25 ENCOUNTER — Ambulatory Visit (HOSPITAL_COMMUNITY): Admission: RE | Admit: 2024-05-25 | Source: Ambulatory Visit

## 2024-05-25 ENCOUNTER — Encounter (HOSPITAL_COMMUNITY): Payer: Self-pay

## 2024-05-28 ENCOUNTER — Ambulatory Visit (HOSPITAL_COMMUNITY)
Admission: RE | Admit: 2024-05-28 | Discharge: 2024-05-28 | Disposition: A | Discharge status: Home / Self Care | Source: Ambulatory Visit | Attending: Orthopaedic Surgery | Admitting: Orthopaedic Surgery

## 2024-05-28 DIAGNOSIS — S92144A Nondisplaced dome fracture of right talus, initial encounter for closed fracture: Secondary | ICD-10-CM | POA: Insufficient documentation

## 2024-05-29 NOTE — Progress Notes (Addendum)
 "  Name: Tara Lewis Date: 05/30/2024 MRN: [id number] DOB: 02/04/1966 PCP: [name]  Reason for visit:  Chief Complaint  Patient presents with   Right Elbow - Follow-up   Left Hand - Pain   Subjective:   Patient is a very pleasant 59 year old female who presents today for repeat check of her right elbow and new acute on chronic ulnar-sided left hand swelling.  Patient sustained a fall several weeks ago, is currently seeing foot and ankle specialist.  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  Has had CT, due to follow-up with foot and ankle specialist tomorrow.  She does state however her boot feels too big and that she is sliding around in this.  Ambulates on the lower extremity with a cane.  She notes regarding her hand, she was diagnosed in the past with swelling by Dr. Case, MRI of the past showed a ganglion cyst along the ulnar border.  She never had this drained but states ever since the cold weather and especially lately she is having intermittent swelling to this hand, no infectious signs or symptoms.  Regarding your elbow, still is about 4/10 pain that she states she is always have pain with this elbow.  Is well localized over the lateral aspect.  Difficulty with certain movements.   Patient is a very pleasant 59 year old female who presents today for MRI results of the right ankle.  Briefly she sustained an injury about 6 to 7 weeks ago, she presented to our office 1 week ago.  We performed x-rays which were grossly negative however due to her severe pain and significant swelling as well as chronicity of her symptoms we elected to proceed forward with MRI of the ankle.  She also was treated for elbow injury, suspect possible sprain of the lateral collateral ligament.  Of note her elbow is doing well, decreased following since prescribing the compression sleeve.  We prescribed OT but she is not as concerned about her elbow at this  time.  She has tried to stay off of her ankle is much as possible, she has tried use crutches.  She presents today in the boot.  The pain does interrupt her sleep.  Patient has a history of alcoholism but has since been sober.  Takes ibuprofen and Tylenol during the day for her pain, also takes muscle relaxant but her pain persists.  Tramadol initially helped her pain but since does not.  [name] is a 59 year old female who presents today for acute right elbow pain and right ankle pain.  Unfortunately over 5 weeks ago she was waking up from sleep, she stepped down and inadvertently supinated her right foot and ankle.  She notes she also injured her right elbow during this incident but cannot specify exactly how.  Unfortunately she sustained a repeat supination injury and injury to her elbow several days later.  She presented to urgent care, x-rays were negative for obvious acute bony pathology including fracture.  She was placed into a short boot which she presents in today.  She does have relief of her symptoms while in the boot but notes she has ongoing pain swelling and difficulty to her ankle that is relatively unchanged.  After being on her ankle for most of the day will be significant swelling, she presents today with representation of the swelling on her cell phone.  She presents with at least moderate edema about the lateral ankle today.  With regarding  her elbow, the pain is primarily over the lateral epicondyle where there is some slight swelling if not a knot present.  She does receive relief from lidocaine  patches which she uses.  Patient also being treated for her cervical and lumbar spine.  I have seen the patient previously as well for other orthopedic complaints, please see other notes regarding this.  Past Medical History[1]  Past Surgical History[2]  Current Medications[3]  Allergies[4]  Family History[5]  Social History   Occupational History   Not on file  Tobacco Use    Smoking status: Former    Current packs/day: 2.00    Average packs/day: 2.0 packs/day for 25.0 years (50.0 ttl pk-yrs)    Types: Cigarettes    Passive exposure: Past (has not smoked in 4 days)   Smokeless tobacco: Never  Vaping Use   Vaping status: Never Used  Substance and Sexual Activity   Alcohol use: Not Currently    Alcohol/week: 25.0 standard drinks of alcohol    Types: 25 Shots of liquor per week    Comment: has not had a drink since March 05, 2023   Drug use: Yes    Frequency: 3.0 times per week    Types: Marijuana   Sexual activity: Yes    Constitutional: Denies chills, fevers, sweats, unusual or increased fatigue, weight changes. Neurological: Denies upper or lower extremity paresthesia or vertigo. Cardiovascular: Denies chest pain, dyspnea on exertion, edema. Respiratory: Denies cough, shortness of breath, wheezing. GI: Denies changes in appetite, abdominal pain or discomfort. GU: Denies any change in bowel habit. MSK: As per HPI.  Skin: Denies rash, lesions. Hematological: Denies bruising, swollen lymph nodes. Endocrine: Denies lethargy, skin changes.  Physical exam: BP 113/75 (BP Position: Sitting)   Pulse 104   Wt 75.2 kg (165 lb 11.2 oz)   BMI 26.76 kg/m :   GEN: 59 year old female in NAD; Sitting comfortably in office, pleasant. HEAD: Normocephalic, atraumatic. EYES: Sclera non-icteric EOMI. ENT: Ears externally normal. No nasal congestion.  LUNGS: Normal respiratory effort. No retractions or cough noted. NEURO: CN II-XII grossly intact; MAEE. No focal neurologic deficits noted. PSYCH: Appropriate affect. Alert and oriented. DERM: Warm, dry. Appropriate turgor. No rashes or lesions noted. MSK: Presents today with short boot, presents with a cane to assist with ambulation.  Is weightbearing with assistive device on the right lower extremity.  Regarding her right elbow, there is pain over the lateral aspect including over the LCL, there is minimal  edema over this area, no gross elbow effusion.  No pain with passive or active range of motion.  There is full range of motion through flexion extension supination pronation.  There is some pain with grip however over the lateral elbow.  No discrete radial tunnel symptoms.  No paresthesia.  There is stable varus stress test for me today with firm endpoint but with discomfort and pain for the patient.  Regarding her left hand, there is no significant tenderness over the ulnar border, no tenderness along the fifth metacarpal.  There is slight weakness through grip estimated 4/5.  There is slight swelling/mild edema which is compressible most consistent with a ganglion cyst over the soft tissues ulnar aspect.  Nonpulsatile.  No symptoms to the wrist.  Able to flex and extend all 5 fingers though slight decreased range of motion through the terminal end of composite fist is noted to the pinky finger.  Medical Decision Making: X-ray of the left hand today shows no obvious acute fracture, no dislocation, arthropathy  present, there is soft tissue swelling over the ulnar border.  Negative ulnar variance.  MRI hand with and without radiology read 2018: Soft tissues  Markers are placed about the region of concern along the ulnar  aspect of the hand. A fluid collection is seen extending from the  ulnar margin of the hook of the hamate into the soft tissues of the  hypothenar eminence within the flexor digiti minimi brevis. Discrete  measurement is not possible due to the insinuating nature of the  collection but it measures approximately 3.4 cm craniocaudal by up  to 2.3 cm transverse by 0.8 cm AP. No enhancing nodule is seen  within the collection and it demonstrates complete signal dropout on  fat saturation postcontrast imaging.   IMPRESSION:  Fluid collection extending from the ulnar margin of the hook of the  hamate into the hypothenar musculature as described above is most  consistent with a ganglion  cyst. The appearance is not suggestive of  aggressive neoplasm. Aspiration is recommended for further  evaluation.  Ulnar minus variance.   MRI ankle: Impression     1.    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 Linear T1 hypointense signal just below the articular surface of the posterior facet in this region of avascular necrosis, raising the question of a possible superimposed nondisplaced incomplete fracture.     2.    Grade 1 sprain of the deep deltoid ligament.   X-ray of the right elbow shows no obvious acute fracture, no dislocation, no elbow effusion.  Possible cortical thickening noted to the proximal radius though may be purely projectional.  Nonweightbearing x-ray of the right ankle shows no obvious acute fracture, no dislocation, ossific density is noted just adjacent to the medial talus which is likely chronic, minimal degenerative changes are noted, no disruption of the syndesmosis or tibial plafond, there is no medial clear space widening.  Possible lucency noted to the distal tip of the lateral malleolus though may be purely projectional in nature.  Only evident on AP view.  1. Right elbow pain   2. Sprain of lateral collateral ligament of elbow, right, sequela   3. Left hand pain   4. Closed nondisplaced fracture of dome of right talus, initial encounter   5. Elbow injury, right, sequela   6. 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 Mass of soft tissue of hand   8. Derangement, elbow internal    Plan:  Orders Placed This Encounter  Procedures   MRI Upper Extremity Joint Right Wo Contrast    **Waiting for auth    Standing Status:   Future    Expected Date:   06/12/2024    Expiration Date:   05/29/2025    Which body part?:   Elbow    Reason for Exam::   Elbow derangement, history of injury, swelling    Is the patient pregnant?:   No    What is the patient's sedation requirement?:   No Sedation    Does  the patient have metallic implants or external cardiac devices?:   No Implants    Performed at:   [facility]   XR Hand 3 Or More Views Left    Performed at:   In Clinic    Reason for Exam::   pain    Is the patient pregnant?:   No   MRI Upper Extremity Non-Joint Left W Wo Contrast    **Waiting for  auth    Standing Status:   Future    Expected Date:   06/13/2024    Expiration Date:   05/30/2025    Which body part?:   Hand    Reason for Exam::   Eval soft tissue swelling/mass/cyst left ulnar aspect of the hand    Is the patient pregnant?:   No    What is the patient's sedation requirement?:   No Sedation    Does the patient have metallic implants or external cardiac devices?:   No Implants    Performed at:   [facility]    Does the patient have any history of allergic reaction during injection of IV contrast?:   No             unknown   I had a good, long conversation today with the patient.  We did discuss the x-ray and exam in detail.  We did discuss the nature of the condition and multiple treatment options including ice, heat, topical modalities such as lidocaine  patches, Voltaren  gel, or Biofreeze or Aspercreme with lidocaine , bracing, physical therapy, activity modification, nerve block, p.o. medication including anti-inflammatories such as Celebrex, Mobic, Advil, or ibuprofen or naproxen, Tylenol including Tylenol arthritis, or Medrol  Dosepak, or muscle relaxant, corticosteroid injection, viscosupplementation injection, advanced imaging in the form of MRI, or surgical intervention.  We discussed the following:  Right ankle pain: Currently seeing foot and ankle specialist.  She request new tall boot which was provided for the patient today. Right elbow swelling status post injury: Continues to have ankle pain and difficulty with strength/weightbearing.  We will proceed forward with MRI of the elbow for further evaluation.  May be candidate for OT or corticosteroid injection.  As she has  increased symptoms with a compression sleeve recommend she not wear this.  Continue motion. Left hand soft tissue swelling: We discussed her x-ray and exam in detail.  She has an MRI from 2018 with without contrast showing ganglion cyst over the ulnar aspect soft tissues.  She desires aspiration but I would recommend this be performed under ultrasound guidance.  We discussed treatment options such as repeat MRI, MSK ultrasound, or referral for drainage under aspiration guidance.  We will reach out to Dr. Jacques at [facility] for potential aspiration of the cyst under ultrasound guidance.  Will follow-up the patient after we proceed with further consultation regarding this.  Will perform repeat MRI to determine size and nature of the cyst as previous advanced imaging is from 6 years ago.  Follow-up post MRI of the elbow to discuss results and treatment options.  TBD regarding the left hand cyst.  She is encouraged to reach out for any increased concerns, problems, questions, or complaints.  Patient verbalized understanding and agreed with this plan today.  Return for TBD.  Heinz CANDIE Jacobs, DNP, APRN, FNP-C, ONP-C       [1] Past Medical History: Diagnosis Date   Asthma (HHS-HCC)    COPD (chronic obstructive pulmonary disease) (CMS-HCC)    Disease of thyroid  gland   [2] Past Surgical History: Procedure Laterality Date   KNEE ARTHROSCOPY Right   [3] Current Outpatient Medications  Medication Sig Dispense Refill   albuterol  2.5 mg /3 mL (0.083 %) nebulizer solution USE 3 ML IN NEBULIZER 4 TIMES DAILY AS NEEDED     allopurinol (ZYLOPRIM) 300 MG tablet Take 1 tablet (300 mg total) by mouth nightly.     ALPRAZolam (XANAX) 0.5 MG tablet Take 1 tablet (0.5 mg  total) by mouth daily as needed for anxiety.     benzonatate (TESSALON) 200 MG capsule Take 1 capsule (200 mg total) by mouth Three (3) times a day.     celecoxib (CELEBREX) 200 MG capsule Take 1 capsule (200 mg total)  by mouth two (2) times a day.     cyclobenzaprine (FLEXERIL) 10 MG tablet Take 1 tablet (10 mg total) by mouth Three (3) times a day as needed.     DULoxetine  (CYMBALTA ) 60 MG capsule Take 1 capsule (60 mg total) by mouth daily.     levalbuterol  (XOPENEX  HFA) 45 mcg/actuation inhaler INHALE 2 PUFFS INTO LUNGS EVERY 6 HOURS AS NEEDED FOR WHEEZING AND FOR SHORTNESS OF BREATH     levothyroxine  (SYNTHROID ) 50 MCG tablet Take 1 tablet (50 mcg total) by mouth daily.     lidocaine  (LIDODERM ) 5 % patch Place 1 patch on the skin every twelve (12) hours. Apply to affected area for 12 hours only each day (then remove patch) 10 patch 0   multivitamins, therapeutic with minerals 9 mg iron-400 mcg tablet Take 1 tablet by mouth daily. 30 tablet 0   nystatin (MYCOSTATIN) 100,000 unit/mL suspension      pantoprazole  (PROTONIX ) 40 MG tablet      SPIRIVA  RESPIMAT 2.5 mcg/actuation inhalation mist      thiamine mononitrate, vit B1, 100 mg Tab tablet Take 1 tablet (100 mg total) by mouth daily. 30 tablet 0   traMADol (ULTRAM) 50 mg tablet TAKE 1 TABLET BY MOUTH EVERY 4 TO 6 HOURS AS NEEDED FOR PAIN FOR 5 DAYS     traZODone (DESYREL) 100 MG tablet Take 1 tablet (100 mg total) by mouth nightly.     dicyclomine (BENTYL) 20 mg tablet Take 1 tablet (20 mg total) by mouth in the morning.     naloxone (NARCAN) 4 mg nasal spray 1 spray (4 mg total) into alternating nostrils once as needed for up to 1 dose. One spray in either nostril once for known/suspected opioid overdose. May repeat every 2-3 minutes in alternating nostril til EMS arrives 1 each 0   SYMBICORT  80-4.5 mcg/actuation inhaler SMARTSIG:2 Puff(s) By Mouth Twice Daily     No current facility-administered medications for this visit.  [4] Allergies Allergen Reactions   Grass Pollen Other (See Comments)   Other Other (See Comments)    Skin tears easily  Makes legs hurt   Codeine Nausea Only and Other (See Comments)    Patient reports stomach  pain.   Statins-Hmg-Coa Reductase Inhibitors Other (See Comments)    Makes legs hurt  [5] History reviewed. No pertinent family history. "

## 2024-06-02 ENCOUNTER — Ambulatory Visit: Admitting: Internal Medicine

## 2024-06-02 ENCOUNTER — Ambulatory Visit: Admitting: Nurse Practitioner

## 2024-06-03 ENCOUNTER — Ambulatory Visit: Admitting: Nurse Practitioner

## 2024-06-04 ENCOUNTER — Encounter: Payer: Self-pay | Admitting: *Deleted

## 2024-06-04 ENCOUNTER — Ambulatory Visit: Payer: Self-pay | Admitting: Internal Medicine

## 2024-06-04 ENCOUNTER — Encounter: Payer: Self-pay | Admitting: Nurse Practitioner

## 2024-06-04 ENCOUNTER — Ambulatory Visit: Admitting: Nurse Practitioner

## 2024-06-04 VITALS — BP 94/60 | HR 96 | Ht 65.5 in | Wt 155.0 lb

## 2024-06-04 DIAGNOSIS — J449 Chronic obstructive pulmonary disease, unspecified: Secondary | ICD-10-CM

## 2024-06-04 DIAGNOSIS — R0602 Shortness of breath: Secondary | ICD-10-CM

## 2024-06-04 DIAGNOSIS — Z87898 Personal history of other specified conditions: Secondary | ICD-10-CM

## 2024-06-04 NOTE — Progress Notes (Unsigned)
 " Cardiology Office Note   Date:  06/04/2024 ID:  Tara Lewis, DOB 12/09/1965, MRN [id number] PCP: Rosan Jacquline NOVAK, NP  [facility] Providers Cardiologist:  Diannah SHAUNNA Maywood, MD     History of Present Illness Tara Lewis is a 59 y.o. female with a PMH of Graves' disease, s/p RAI ablation -> hypothyroidism, GERD, COPD, fibromyalgia, anxiety, depression, tobacco abuse, who presents today for follow-up.  Last seen by Dr. Mallipeddi on April 06, 2023 for evaluation of hypotension.  It was noted patient had decreased p.o. intake for 3 days prior to hospitalization at [facility] in November 2024.  Also noted to be consuming alcohol every day, noted abdominal pain.  Was feeling dizzy with getting up and walking, BP noted low, SBP around 80s.  Hospital admission November 2024 for alcohol induced acute pancreatitis/colitis.  Blood pressure improved, denied any chest pain.  Continue to note feeling dizzy but overall improved.  Denied any syncope.  Noted to be sober for 30 days at the time.  Monitor was obtained-see results below.   11/07/2023 - She is here for follow-up today with her husband.  Overall doing well from a cardiac perspective.  Does tell me that prior to her hospital/ED visit at [facility] on July 4 she, she was having no appetite, no energy running a fever, and having cold chills per husband's report.  She shows me chest x-ray report that was negative for anything acute.  Overall lab work unremarkable.  She currently wears 3 L of oxygen continuously-previously she had been wearing only 2 L oxygen at night or as needed.  Does follow pulmonologist regularly.  Says her increased oxygen demand began earlier this year in March 2025 with previous hospital stay for double pneumonia, this is where her oxygen therapy came into play.  Overall breathing is stable.  Does have some moments where she will wake up with gasping and coughs up phlegm.  Says this has cleared up, currently taking Mucinex  per husband's report.  Overall feeling well today. Denies any chest pain, worsening shortness of breath, recent palpitations, syncope, presyncope, dizziness, orthopnea, PND, swelling or significant weight changes, acute bleeding, or claudication.  Tells me she had a stress test with [facility] last month that was negative.  I do not have these results as of yet.  She is currently being followed by her PCP at [facility] internal for her thyroid  disease. Does notice some palpitations at times, not recently though  02/25/2024 -here for follow-up.  She says she has improved quite a bit healthwise since I last saw her.  Continues to note back/neck pains, scheduled for back procedure/ablation in November 2024.  She has had issues with her thyroid  recently, admits to weight gain related to this.  She is following her primary care doctor who is managing this.  Admits to some shortness of breath noticed by her. Denies any chest pain, palpitations, syncope, presyncope, dizziness, orthopnea, PND, swelling, acute bleeding, or claudication.  06/04/2024 - Here for follow-up. Doing well from a cardiac perspective.  She is currently in a boot along her right lower extremity due to some avascular necrosis, following a specialist regarding this.  Previously had twisted her ankle.  Now wearing continuous oxygen around 3 L which is new per her report, used to be wearing intermittent or as needed oxygen. Following Pulmonology. Denies any chest pain, palpitations, syncope, presyncope, dizziness, orthopnea, PND, swelling or significant weight changes, acute bleeding, or claudication. Breathing is stable.   ROS: Negative. See HPI.  Studies Reviewed  EKG: EKG is not ordered today.   Echo 03/2024:  1. Left ventricular ejection fraction, by estimation, is 60 to 65%. The  left ventricle has normal function. The left ventricle has no regional  wall motion abnormalities. Left ventricular diastolic parameters are  indeterminate. The average  left  ventricular global longitudinal strain is -17.4 %. The global longitudinal  strain is normal.   2. Right ventricular systolic function is normal. The right ventricular  size is normal. There is normal pulmonary artery systolic pressure.   3. The mitral valve is abnormal. Mild mitral valve regurgitation. No  evidence of mitral stenosis.   4. The aortic valve is tricuspid. Aortic valve regurgitation is not  visualized. No aortic stenosis is present.   5. The inferior vena cava is normal in size with greater than 50%  respiratory variability, suggesting right atrial pressure of 3 mmHg.   Comparison(s): No prior Echocardiogram.    Cardiac monitor 03/2024:    Patch wear time was for 13 days and 23 hours.   Normal sinus rhythm predominantly ranging from 65 to 211 bpm with an average HR 90 bpm.   1 run of SVT lasting 4 beats, 1 run of NSVT lasting 5 beats.  No AV block or pauses.   <1% PAC burden and <1% PVC burden.   Symptoms correlated with normal sinus rhythm, 89 to 99 bpm.   Vascular US  lower extremity venous reflux right 04/2023:  Summary:  Right:  - No evidence of deep vein thrombosis from the common femoral through the  popliteal veins.  - No evidence of superficial venous thrombosis.  - The deep venous system is not competent.  - The great saphenous vein is not competent (proximal and mid thigh only).  -The small saphenous vein is competent.    *See table(s) above for measurements and observations.   Physical Exam VS:  BP 94/60 (BP Location: Right Arm, Patient Position: Sitting, Cuff Size: Normal)   Pulse 96   Ht 5' 5.5 (1.664 m)   Wt 155 lb (70.3 kg)   SpO2 (!) 88% Comment: on 3L O2 via [place]  BMI 25.40 kg/m        Wt Readings from Last 3 Encounters:  06/04/24 155 lb (70.3 kg)  02/25/24 170 lb 12.8 oz (77.5 kg)  12/10/23 164 lb 3.2 oz (74.5 kg)    GEN: Well nourished, well developed in no acute distress NECK: No JVD; No carotid bruits CARDIAC: S1/S2, RRR, no  murmurs, rubs, gallops RESPIRATORY:  Clear to auscultation without rales, wheezing or rhonchi  ABDOMEN: Soft, non-tender, non-distended EXTREMITIES:  No edema; No deformity   ASSESSMENT AND PLAN  Hx of palpitations Denies any palpitations or tachycardia. This is usually associated after using her albuterol  inhaler.  Want to avoid beta-blockers due to her COPD status.  If heart rate control needed in the future-would recommend diltiazem.  COPD, shortness of breath Now wearing continuous oxygen. Breathing is stable.  She is not in acute distress.  Continue to follow-up with pulmonology.  Care and ED precautions discussed.    Blood pressure is soft today, pt is asymptomatic regarding this and currently not on any BP medicines that should be causing this.  Possibly might be dehydrated. Care and ED precautions discussed.     Dispo: Follow-up with MD/APP in 6 months or sooner any changes.  Signed, Almarie Crate, NP   "

## 2024-06-04 NOTE — Patient Instructions (Signed)

## 2024-06-06 ENCOUNTER — Ambulatory Visit: Admitting: Student in an Organized Health Care Education/Training Program

## 2024-06-09 ENCOUNTER — Ambulatory Visit: Admitting: Student in an Organized Health Care Education/Training Program

## 2024-06-11 ENCOUNTER — Ambulatory Visit: Admitting: Student in an Organized Health Care Education/Training Program

## 2024-06-11 ENCOUNTER — Ambulatory Visit (HOSPITAL_COMMUNITY)

## 2024-06-12 ENCOUNTER — Ambulatory Visit: Admitting: Student in an Organized Health Care Education/Training Program

## 2024-06-24 ENCOUNTER — Ambulatory Visit: Admitting: Internal Medicine
